# Patient Record
Sex: Male | Born: 1975 | Race: Black or African American | Hispanic: No | Smoking: Current every day smoker
Health system: Southern US, Community
[De-identification: ages and names within clinical notes are randomized; demographics above are authoritative.]

## PROBLEM LIST (undated history)

## (undated) DIAGNOSIS — L511 Stevens-Johnson syndrome: Secondary | ICD-10-CM

## (undated) DIAGNOSIS — D649 Anemia, unspecified: Secondary | ICD-10-CM

---

## 2000-08-09 ENCOUNTER — Emergency Department (HOSPITAL_COMMUNITY): Admission: EM | Admit: 2000-08-09 | Discharge: 2000-08-09 | Payer: Self-pay | Admitting: *Deleted

## 2000-12-18 ENCOUNTER — Inpatient Hospital Stay (HOSPITAL_COMMUNITY): Admission: EM | Admit: 2000-12-18 | Discharge: 2000-12-21 | Payer: Self-pay | Admitting: Emergency Medicine

## 2003-03-15 ENCOUNTER — Emergency Department (HOSPITAL_COMMUNITY): Admission: EM | Admit: 2003-03-15 | Discharge: 2003-03-15 | Payer: Self-pay | Admitting: Emergency Medicine

## 2009-07-28 ENCOUNTER — Observation Stay (HOSPITAL_COMMUNITY): Admission: EM | Admit: 2009-07-28 | Discharge: 2009-07-31 | Payer: Self-pay | Admitting: Emergency Medicine

## 2009-07-31 ENCOUNTER — Ambulatory Visit: Payer: Self-pay | Admitting: Oncology

## 2009-08-16 ENCOUNTER — Encounter: Payer: Self-pay | Admitting: Infectious Diseases

## 2009-08-16 LAB — CBC WITH DIFFERENTIAL/PLATELET
BASO%: 0.8 % (ref 0.0–2.0)
Basophils Absolute: 0.1 10*3/uL (ref 0.0–0.1)
EOS%: 3.6 % (ref 0.0–7.0)
Eosinophils Absolute: 0.4 10*3/uL (ref 0.0–0.5)
HCT: 35.3 % — ABNORMAL LOW (ref 38.4–49.9)
HGB: 10.6 g/dL — ABNORMAL LOW (ref 13.0–17.1)
LYMPH%: 17.9 % (ref 14.0–49.0)
MCH: 21.2 pg — ABNORMAL LOW (ref 27.2–33.4)
MCHC: 30 g/dL — ABNORMAL LOW (ref 32.0–36.0)
MCV: 70.5 fL — ABNORMAL LOW (ref 79.3–98.0)
MONO#: 0.9 10*3/uL (ref 0.1–0.9)
MONO%: 7.6 % (ref 0.0–14.0)
NEUT#: 8.1 10*3/uL — ABNORMAL HIGH (ref 1.5–6.5)
NEUT%: 70.1 % (ref 39.0–75.0)
Platelets: 1013 10*3/uL — ABNORMAL HIGH (ref 140–400)
RBC: 5.01 10*6/uL (ref 4.20–5.82)
RDW: 27.8 % — ABNORMAL HIGH (ref 11.0–14.6)
WBC: 11.5 10*3/uL — ABNORMAL HIGH (ref 4.0–10.3)
lymph#: 2.1 10*3/uL (ref 0.9–3.3)
nRBC: 0 % (ref 0–0)

## 2009-08-16 LAB — MORPHOLOGY: PLT EST: INCREASED

## 2009-08-16 LAB — COMPREHENSIVE METABOLIC PANEL
ALT: 26 U/L (ref 0–53)
AST: 40 U/L — ABNORMAL HIGH (ref 0–37)
Albumin: 3.9 g/dL (ref 3.5–5.2)
Alkaline Phosphatase: 41 U/L (ref 39–117)
BUN: 10 mg/dL (ref 6–23)
CO2: 27 mEq/L (ref 19–32)
Calcium: 9.1 mg/dL (ref 8.4–10.5)
Chloride: 105 mEq/L (ref 96–112)
Creatinine, Ser: 0.95 mg/dL (ref 0.40–1.50)
Glucose, Bld: 90 mg/dL (ref 70–99)
Potassium: 3.8 mEq/L (ref 3.5–5.3)
Sodium: 138 mEq/L (ref 135–145)
Total Bilirubin: 0.7 mg/dL (ref 0.3–1.2)
Total Protein: 7.5 g/dL (ref 6.0–8.3)

## 2009-08-16 LAB — CHCC SMEAR

## 2009-08-16 LAB — LACTATE DEHYDROGENASE: LDH: 133 U/L (ref 94–250)

## 2009-08-20 LAB — SPEP & IFE WITH QIG
Albumin ELP: 56.1 % (ref 55.8–66.1)
Alpha-1-Globulin: 4.8 % (ref 2.9–4.9)
Alpha-2-Globulin: 10 % (ref 7.1–11.8)
Beta 2: 5.9 % (ref 3.2–6.5)
Beta Globulin: 7.2 % (ref 4.7–7.2)
Gamma Globulin: 16 % (ref 11.1–18.8)
IgA: 313 mg/dL (ref 68–378)
IgG (Immunoglobin G), Serum: 1280 mg/dL (ref 694–1618)
IgM, Serum: 141 mg/dL (ref 60–263)
Total Protein, Serum Electrophoresis: 7.2 g/dL (ref 6.0–8.3)

## 2009-08-20 LAB — KAPPA/LAMBDA LIGHT CHAINS
Kappa free light chain: 0.98 mg/dL (ref 0.33–1.94)
Kappa:Lambda Ratio: 1.01 (ref 0.26–1.65)
Lambda Free Lght Chn: 0.97 mg/dL (ref 0.57–2.63)

## 2009-08-20 LAB — SEDIMENTATION RATE: Sed Rate: 10 mm/hr (ref 0–16)

## 2009-08-20 LAB — METHYLMALONIC ACID, SERUM: Methylmalonic Acid, Quantitative: 93 nmol/L (ref 87–318)

## 2009-08-21 ENCOUNTER — Other Ambulatory Visit: Admission: RE | Admit: 2009-08-21 | Discharge: 2009-08-21 | Payer: Self-pay | Admitting: Oncology

## 2009-08-21 LAB — JAK-2 V617F

## 2009-09-18 ENCOUNTER — Ambulatory Visit: Payer: Self-pay | Admitting: Oncology

## 2009-09-24 LAB — CBC WITH DIFFERENTIAL/PLATELET
BASO%: 0.1 % (ref 0.0–2.0)
Basophils Absolute: 0 10*3/uL (ref 0.0–0.1)
EOS%: 6.9 % (ref 0.0–7.0)
Eosinophils Absolute: 0.6 10*3/uL — ABNORMAL HIGH (ref 0.0–0.5)
HCT: 32.2 % — ABNORMAL LOW (ref 38.4–49.9)
HGB: 10.2 g/dL — ABNORMAL LOW (ref 13.0–17.1)
LYMPH%: 23.6 % (ref 14.0–49.0)
MCH: 22.2 pg — ABNORMAL LOW (ref 27.2–33.4)
MCHC: 31.7 g/dL — ABNORMAL LOW (ref 32.0–36.0)
MCV: 69.9 fL — ABNORMAL LOW (ref 79.3–98.0)
MONO#: 0.6 10*3/uL (ref 0.1–0.9)
MONO%: 7.9 % (ref 0.0–14.0)
NEUT#: 4.9 10*3/uL (ref 1.5–6.5)
NEUT%: 61.5 % (ref 39.0–75.0)
Platelets: 205 10*3/uL (ref 140–400)
RBC: 4.61 10*6/uL (ref 4.20–5.82)
RDW: 28.6 % — ABNORMAL HIGH (ref 11.0–14.6)
WBC: 8 10*3/uL (ref 4.0–10.3)
lymph#: 1.9 10*3/uL (ref 0.9–3.3)

## 2010-01-31 ENCOUNTER — Ambulatory Visit: Payer: Self-pay | Admitting: Oncology

## 2010-04-16 NOTE — Consult Note (Signed)
[facility]: [facility]   Imported By: Florinda Marker 09/13/2009 14:31:18  _____________________________________________________________________  External Attachment:    Type:   Image     Comment:   External Document

## 2010-06-03 LAB — CBC
HCT: 22.1 % — ABNORMAL LOW (ref 39.0–52.0)
HCT: 31 % — ABNORMAL LOW (ref 39.0–52.0)
HCT: 32.1 % — ABNORMAL LOW (ref 39.0–52.0)
HCT: 32.5 % — ABNORMAL LOW (ref 39.0–52.0)
Hemoglobin: 10 g/dL — ABNORMAL LOW (ref 13.0–17.0)
Hemoglobin: 10.2 g/dL — ABNORMAL LOW (ref 13.0–17.0)
Hemoglobin: 6.8 g/dL — CL (ref 13.0–17.0)
MCHC: 30.9 g/dL (ref 30.0–36.0)
MCHC: 31 g/dL (ref 30.0–36.0)
MCHC: 31.8 g/dL (ref 30.0–36.0)
MCHC: 32.1 g/dL (ref 30.0–36.0)
MCV: 65.3 fL — ABNORMAL LOW (ref 78.0–100.0)
MCV: 70.4 fL — ABNORMAL LOW (ref 78.0–100.0)
MCV: 70.5 fL — ABNORMAL LOW (ref 78.0–100.0)
Platelets: 62 10*3/uL — ABNORMAL LOW (ref 150–400)
Platelets: 63 10*3/uL — ABNORMAL LOW (ref 150–400)
Platelets: 68 10*3/uL — ABNORMAL LOW (ref 150–400)
Platelets: 74 10*3/uL — ABNORMAL LOW (ref 150–400)
RBC: 3.39 MIL/uL — ABNORMAL LOW (ref 4.22–5.81)
RBC: 4.4 MIL/uL (ref 4.22–5.81)
RBC: 4.56 MIL/uL (ref 4.22–5.81)
RDW: 31.2 % — ABNORMAL HIGH (ref 11.5–15.5)
RDW: 31.6 % — ABNORMAL HIGH (ref 11.5–15.5)
RDW: 32.2 % — ABNORMAL HIGH (ref 11.5–15.5)
RDW: 32.5 % — ABNORMAL HIGH (ref 11.5–15.5)
WBC: 10.7 10*3/uL — ABNORMAL HIGH (ref 4.0–10.5)
WBC: 11.1 10*3/uL — ABNORMAL HIGH (ref 4.0–10.5)
WBC: 13.1 10*3/uL — ABNORMAL HIGH (ref 4.0–10.5)

## 2010-06-03 LAB — CARDIAC PANEL(CRET KIN+CKTOT+MB+TROPI)
CK, MB: 0.5 ng/mL (ref 0.3–4.0)
CK, MB: 0.7 ng/mL (ref 0.3–4.0)
Relative Index: 0.4 (ref 0.0–2.5)
Relative Index: 0.5 (ref 0.0–2.5)
Total CK: 121 U/L (ref 7–232)
Total CK: 134 U/L (ref 7–232)
Troponin I: <0.01 ng/mL (ref 0.00–0.06)
Troponin I: <0.01 ng/mL (ref 0.00–0.06)

## 2010-06-03 LAB — PARVOVIRUS B19 ANTIBODY, IGG AND IGM
Parovirus B19 IgG Abs: 1.4 Index — ABNORMAL HIGH (ref <0.9)
Parovirus B19 IgM Abs: <0.9 Index (ref <0.9)

## 2010-06-03 LAB — HEMOGLOBINOPATHY EVALUATION
Hemoglobin Other: 0 % (ref 0.0–0.0)
Hgb A2 Quant: 2.6 % (ref 2.2–3.2)
Hgb A: 61.4 % — ABNORMAL LOW (ref 96.8–97.8)
Hgb F Quant: 0 % (ref 0.0–2.0)
Hgb S Quant: 36 % — ABNORMAL HIGH (ref 0.0–0.0)

## 2010-06-03 LAB — ANA: Anti Nuclear Antibody(ANA): NEGATIVE

## 2010-06-03 LAB — EPSTEIN-BARR VIRUS VCA ANTIBODY PANEL
EBV EA IgG: 0.69 {ISR}
EBV EA IgG: 0.71 {ISR}
EBV NA IgG: 0.56 {ISR}
EBV NA IgG: 0.73 {ISR}
EBV VCA IgG: 3.4 {ISR} — ABNORMAL HIGH
EBV VCA IgG: 3.89 {ISR} — ABNORMAL HIGH
EBV VCA IgM: 0.12 {ISR}
EBV VCA IgM: 0.16 {ISR}

## 2010-06-03 LAB — LACTATE DEHYDROGENASE: LDH: 98 U/L (ref 94–250)

## 2010-06-03 LAB — TECHNOLOGIST SMEAR REVIEW

## 2010-06-03 LAB — RUBELLA ANTIBODY, IGM: Rubella IgM: <0.9 S/CO Ratio (ref <0.9)

## 2010-06-03 LAB — HEPATITIS PANEL, ACUTE
HCV Ab: NEGATIVE
Hep A IgM: NEGATIVE
Hep B C IgM: NEGATIVE
Hepatitis B Surface Ag: NEGATIVE

## 2010-06-03 LAB — MAGNESIUM: Magnesium: 2.4 mg/dL (ref 1.5–2.5)

## 2010-06-03 LAB — IRON AND TIBC
Iron: 221 ug/dL — ABNORMAL HIGH (ref 42–135)
Saturation Ratios: 54 % (ref 20–55)
TIBC: 406 ug/dL (ref 215–435)

## 2010-06-03 LAB — C-REACTIVE PROTEIN: CRP: 0.9 mg/dL — ABNORMAL HIGH (ref <0.6)

## 2010-06-03 LAB — ETHANOL: Alcohol, Ethyl (B): <5 mg/dL (ref 0–10)

## 2010-06-03 LAB — RETICULOCYTES: RBC.: 4.73 MIL/uL (ref 4.22–5.81)

## 2010-06-03 LAB — BONE MARROW EXAM: Bone Marrow Exam: 414

## 2010-06-03 LAB — PHOSPHORUS: Phosphorus: 4.1 mg/dL (ref 2.3–4.6)

## 2010-06-03 LAB — CHROMOSOME ANALYSIS, BONE MARROW

## 2010-06-03 LAB — VITAMIN B12: Vitamin B-12: 386 pg/mL (ref 211–911)

## 2010-06-03 LAB — HIV ANTIBODY (ROUTINE TESTING W REFLEX): HIV: NONREACTIVE

## 2010-06-04 LAB — CROSSMATCH: Antibody Screen: NEGATIVE

## 2010-06-04 LAB — CULTURE, BLOOD (ROUTINE X 2)
Culture: NO GROWTH
Culture: NO GROWTH

## 2010-06-04 LAB — URINALYSIS, ROUTINE W REFLEX MICROSCOPIC
Bilirubin Urine: NEGATIVE
Glucose, UA: NEGATIVE mg/dL
Ketones, ur: 15 mg/dL — AB
Nitrite: NEGATIVE
Specific Gravity, Urine: 1.011 (ref 1.005–1.030)
pH: 7 (ref 5.0–8.0)

## 2010-06-04 LAB — CBC
Platelets: 82 10*3/uL — ABNORMAL LOW (ref 150–400)
RBC: 3.7 MIL/uL — ABNORMAL LOW (ref 4.22–5.81)
WBC: 13.9 10*3/uL — ABNORMAL HIGH (ref 4.0–10.5)

## 2010-06-04 LAB — RAPID URINE DRUG SCREEN, HOSP PERFORMED
Barbiturates: NOT DETECTED
Benzodiazepines: NOT DETECTED
Cocaine: POSITIVE — AB

## 2010-06-04 LAB — DIFFERENTIAL
Basophils Absolute: 0 10*3/uL (ref 0.0–0.1)
Eosinophils Relative: 1 % (ref 0–5)
Lymphocytes Relative: 13 % (ref 12–46)
Monocytes Relative: 5 % (ref 3–12)
Neutrophils Relative %: 81 % — ABNORMAL HIGH (ref 43–77)

## 2010-06-04 LAB — HEPATIC FUNCTION PANEL
ALT: <8 U/L (ref 0–53)
AST: 15 U/L (ref 0–37)
Albumin: 3.4 g/dL — ABNORMAL LOW (ref 3.5–5.2)
Alkaline Phosphatase: 47 U/L (ref 39–117)
Bilirubin, Direct: 0.1 mg/dL (ref 0.0–0.3)
Total Bilirubin: 0.8 mg/dL (ref 0.3–1.2)

## 2010-06-04 LAB — GLUCOSE, CAPILLARY: Glucose-Capillary: 255 mg/dL — ABNORMAL HIGH (ref 70–99)

## 2010-06-04 LAB — HIV ANTIBODY (ROUTINE TESTING W REFLEX): HIV: NONREACTIVE

## 2010-06-04 LAB — BASIC METABOLIC PANEL
BUN: 10 mg/dL (ref 6–23)
Calcium: 8.8 mg/dL (ref 8.4–10.5)
Chloride: 106 mEq/L (ref 96–112)
Creatinine, Ser: 0.77 mg/dL (ref 0.4–1.5)
GFR calc Af Amer: >60 mL/min (ref >60)
GFR calc non Af Amer: >60 mL/min (ref >60)

## 2010-06-04 LAB — HEMOCCULT GUIAC POC 1CARD (OFFICE): Fecal Occult Bld: NEGATIVE

## 2010-06-04 LAB — ABO/RH: ABO/RH(D): B POS

## 2010-07-09 ENCOUNTER — Emergency Department (HOSPITAL_COMMUNITY)
Admission: EM | Admit: 2010-07-09 | Discharge: 2010-07-09 | Disposition: A | Discharge status: Home / Self Care | Payer: Self-pay | Attending: Emergency Medicine | Admitting: Emergency Medicine

## 2010-07-09 DIAGNOSIS — L0231 Cutaneous abscess of buttock: Secondary | ICD-10-CM | POA: Insufficient documentation

## 2010-08-02 NOTE — Discharge Summary (Signed)
[place]. [facility] [place]  Patient:    John Moody, John Moody Visit Number: [id number] MRN: [id number]          Service Type: MED Location: [phone] Attending Physician:  Madaline Guthrie Dictated by:   Marisue Brooklyn, M.D. Admit Date:  12/18/2000 Discharge Date: 12/21/2000   CC:         Gelene Mink A. Worthy Rancher, M.D.  Orson Ape, MS-4   Discharge Summary  DATE OF BIRTH:  03-07-1976  HOME ADDRESS:  [address], [place], [place] [address].  PHONE NUMBER:  [phone]  DISCHARGE DIAGNOSIS:  Stevens-Johnson reaction.  DISCHARGE MEDICATIONS:  Valtrex 1000 mg a half tablet p.o. q.d., Magic mouthwash gargle and spit p.r.n. for mouth pain, fluocinonide 0.05% apply to effected area sparingly b.i.d., prednisone 20 mg two tablets twice a day for one day and then one tablet twice a day for one day, artificial tears one to two drops in each eye four times a day and p.r.n., Tylenol No. 3 one to two tablets p.o. q.4-6h. p.r.n. pain.  DISPOSITION:  Home.  CONDITION:  Stable.  FOLLOWUP APPOINTMENT:  The patient will contact Dr. Terri Piedra to arrange followup.  HOSPITAL COURSE:  The patient presented with numerous lesions on palms, soles, antecubital fossa, genitals, oral mucosa.  The patient has a history for 11 years of similar lesions.  The patient has been unable to eat because of painful mouth lesions.  The patient was admitted secondary to dehydration and possibility of secondary infection.  The patients lab - blood culture x 2, no growth at five days.  WBC at admission was 15.6, hemoglobin 14.3, hematocrit 43.3, MCV 77.0, platelets 325.  Neutrophils 84%, lymphocytes 6%, monocytes 9%, eosinophil 1%, basophil 0.  Sodium 136, potassium 3.8, chloride 102, CO2 23, glucose 86, BUN 12, creatinine 1.0 with calcium 8.9.  Total protein 7.5, albumin 3.5, AST 25, ALT 9, ALP 53, total bilirubin 1.1.  Dermatology was consulted.  Ophthalmology was  consulted.  The patient is a 35 year old African-American male who presented with bullous lesions on the arms, genitals, oral mucosa, and palms.  Target lesions were present on the palms and soles.  The patient was placed on acyclovir and prednisone.  The patient has had negative herpes cultures in the past, but has improved per report by Dr. Terri Piedra on acyclovir treatment.  Indeed, over the next two days, the patients lesions improved.  On admission, the patient was tolerating food and p.o. medicines.  The patients pain was controlled with Tylenol No. 3.  An HSV/PCR performed during this hospitalization was also negative.   Dictated by Arlys John ________, MS-4 Dictated by:   Marisue Brooklyn, M.D. Attending Physician:  Madaline Guthrie DD:  12/22/00 TD:  12/23/00 Job: [id number]

## 2012-08-03 ENCOUNTER — Encounter (HOSPITAL_COMMUNITY): Payer: Self-pay | Admitting: *Deleted

## 2012-08-03 ENCOUNTER — Emergency Department (HOSPITAL_COMMUNITY)
Admission: EM | Admit: 2012-08-03 | Discharge: 2012-08-03 | Disposition: A | Discharge status: Home / Self Care | Payer: Self-pay | Attending: Emergency Medicine | Admitting: Emergency Medicine

## 2012-08-03 ENCOUNTER — Emergency Department (HOSPITAL_COMMUNITY): Payer: Self-pay

## 2012-08-03 DIAGNOSIS — Z872 Personal history of diseases of the skin and subcutaneous tissue: Secondary | ICD-10-CM | POA: Insufficient documentation

## 2012-08-03 DIAGNOSIS — R590 Localized enlarged lymph nodes: Secondary | ICD-10-CM

## 2012-08-03 DIAGNOSIS — S0083XA Contusion of other part of head, initial encounter: Secondary | ICD-10-CM

## 2012-08-03 DIAGNOSIS — Y92410 Unspecified street and highway as the place of occurrence of the external cause: Secondary | ICD-10-CM | POA: Insufficient documentation

## 2012-08-03 DIAGNOSIS — R599 Enlarged lymph nodes, unspecified: Secondary | ICD-10-CM | POA: Insufficient documentation

## 2012-08-03 DIAGNOSIS — Y9389 Activity, other specified: Secondary | ICD-10-CM | POA: Insufficient documentation

## 2012-08-03 DIAGNOSIS — S0003XA Contusion of scalp, initial encounter: Secondary | ICD-10-CM | POA: Insufficient documentation

## 2012-08-03 HISTORY — DX: Stevens-Johnson syndrome: L51.1

## 2012-08-03 MED ORDER — HYDROCODONE-ACETAMINOPHEN 5-325 MG PO TABS
2.0000 | ORAL_TABLET | Freq: Once | ORAL | Status: AC
  Administered 2012-08-03: 2 via ORAL
  Filled 2012-08-03: qty 2

## 2012-08-03 MED ORDER — NAPROXEN 500 MG PO TABS: 500.0000 mg | ORAL_TABLET | Freq: Two times a day (BID) | ORAL | Status: DC
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
## 2012-08-04 NOTE — ED Provider Notes (Signed)
Medical screening examination/treatment/procedure(s) were performed by non-physician practitioner and as supervising physician I was immediately available for consultation/collaboration.   Quiana Cobaugh, MD 08/04/12 0026 

## 2012-08-18 ENCOUNTER — Emergency Department (HOSPITAL_COMMUNITY)
Admission: EM | Admit: 2012-08-18 | Discharge: 2012-08-18 | Disposition: A | Discharge status: Home / Self Care | Payer: Self-pay | Attending: Emergency Medicine | Admitting: Emergency Medicine

## 2012-08-18 ENCOUNTER — Encounter (HOSPITAL_COMMUNITY): Payer: Self-pay | Admitting: Emergency Medicine

## 2012-08-18 DIAGNOSIS — Z872 Personal history of diseases of the skin and subcutaneous tissue: Secondary | ICD-10-CM | POA: Insufficient documentation

## 2012-08-18 DIAGNOSIS — F17200 Nicotine dependence, unspecified, uncomplicated: Secondary | ICD-10-CM | POA: Insufficient documentation

## 2012-08-18 DIAGNOSIS — B029 Zoster without complications: Secondary | ICD-10-CM | POA: Insufficient documentation

## 2012-08-18 MED ORDER — HYDROCODONE-ACETAMINOPHEN 5-325 MG PO TABS: ORAL_TABLET | ORAL | Status: DC

## 2012-08-18 MED ORDER — TETRACAINE HCL 0.5 % OP SOLN
2.0000 [drp] | Freq: Once | OPHTHALMIC | Status: DC
  Filled 2012-08-18: qty 2

## 2012-08-18 MED ORDER — ACYCLOVIR 200 MG PO CAPS
800.0000 mg | ORAL_CAPSULE | ORAL | Status: AC
  Administered 2012-08-18: 800 mg via ORAL
  Filled 2012-08-18 (×2): qty 4

## 2012-08-18 MED ORDER — FLUORESCEIN SODIUM 1 MG OP STRP
1.0000 | ORAL_STRIP | Freq: Once | OPHTHALMIC | Status: AC
  Administered 2012-08-18: 1 via OPHTHALMIC
  Filled 2012-08-18: qty 1

## 2012-08-18 MED ORDER — VALACYCLOVIR HCL 1 G PO TABS: 1000.0000 mg | ORAL_TABLET | Freq: Three times a day (TID) | ORAL | Status: AC

## 2012-08-18 NOTE — ED Provider Notes (Signed)
History     CSN: [id number]  Arrival date & time 08/18/12  1225   First MD Initiated Contact with Patient 08/18/12 1252      Chief Complaint  Patient presents with  . Rash    (Consider location/radiation/quality/duration/timing/severity/associated sxs/prior treatment) HPI  John Moody is a 37 y.o. male painful rash to left forehead onset 3 days ago, she states the rash is also in his hair . Patient's denies change in vision. He had chickenpox as a child.  Denies fever chest pain, shortness of breath, nausea vomiting, change in bowel or bladder habits.  Past Medical History  Diagnosis Date  . Stevens-Johnson disease     No past surgical history on file.  No family history on file.  History  Substance Use Topics  . Smoking status: Current Every Day Smoker  . Smokeless tobacco: Not on file  . Alcohol Use: Yes      Review of Systems  Constitutional: Negative for fever.  Respiratory: Negative for shortness of breath.   Cardiovascular: Negative for chest pain.  Gastrointestinal: Negative for nausea, vomiting, abdominal pain and diarrhea.  Skin: Positive for rash.  All other systems reviewed and are negative.    Allergies  Review of patient's allergies indicates no known allergies.  Home Medications   Current Outpatient Rx  Name  Route  Sig  Dispense  Refill  . ibuprofen (ADVIL,MOTRIN) 200 MG tablet   Oral   Take 200 mg by mouth every 6 (six) hours as needed for pain.         . naproxen (NAPROSYN) 500 MG tablet   Oral   Take 1 tablet (500 mg total) by mouth 2 (two) times daily with a meal.   30 tablet   0     BP 103/72  Pulse 69  Temp(Src) 98.4 F (36.9 C)  Resp 16  SpO2 100%  Physical Exam  Nursing note and vitals reviewed. Constitutional: He is oriented to person, place, and time. He appears well-developed and well-nourished. No distress.  HENT:  Head: Normocephalic.    Vesicular rash to ophthalmic division of left ophthalmic nerve.    Eyes: Conjunctivae and EOM are normal.  Cardiovascular: Normal rate.   Pulmonary/Chest: Effort normal. No stridor.  Musculoskeletal: Normal range of motion.  Neurological: He is alert and oriented to person, place, and time.  Psychiatric: He has a normal mood and affect.    ED Course  Procedures (including critical care time)  Labs Reviewed - No data to display No results found.   1. Zoster       MDM   Filed Vitals:   08/18/12 1245  BP: 103/72  Pulse: 69  Temp: 98.4 F (36.9 C)  Resp: 16  SpO2: 100%     John Moody is a 37 y.o. male  with rash consistent with zoster I am concerned for ophthalmicus. left left eye fluorescein stain shows no dendritic lesions. I've asked him to follow with ophthalmology.  Medications  tetracaine (PONTOCAINE) 0.5 % ophthalmic solution 2 drop (not administered)  acyclovir (ZOVIRAX) 200 MG capsule 800 mg (800 mg Oral Given 08/18/12 1454)  fluorescein ophthalmic strip 1 strip (1 strip Both Eyes Given 08/18/12 1456)   This is a shared visit with the attending physician who personally evaluated the patient and agrees with the care plan.   The patient is hemodynamically stable, appropriate for, and amenable to, discharge at this time. Pt verbalized understanding and agrees with care plan. Outpatient follow-up and return  precautions given.    Discharge Medication List as of 08/18/2012  2:59 PM    START taking these medications   Details  HYDROcodone-acetaminophen (NORCO/VICODIN) 5-325 MG per tablet Take 1-2 tablets by mouth every 6 hours as needed for pain., Print    valACYclovir (VALTREX) 1000 MG tablet Take 1 tablet (1,000 mg total) by mouth 3 (three) times daily., Starting 08/18/2012, Last dose on Wed 09/01/12, [employer], PA-C 08/18/12 1640

## 2012-08-18 NOTE — ED Notes (Signed)
Rash on left side of forehead strated on Monday no diff breathing

## 2012-08-18 NOTE — ED Notes (Signed)
Pt reports with rash on forehead, nose and knot behind ear.  Pt co headache

## 2012-08-19 NOTE — ED Provider Notes (Signed)
Medical screening examination/treatment/procedure(s) were conducted as a shared visit with non-physician practitioner(s) and myself.  I personally evaluated the patient during the encounter Pt c/o painful rash to left forehead/frontal scalp for past couple days. No eye pain or decreased vision. Exam c/w shingles. Will check eye/cornea, flourscein. Rx.   Suzi Roots, MD 08/19/12 [phone]

## 2015-02-16 ENCOUNTER — Emergency Department (HOSPITAL_COMMUNITY)
Admission: EM | Admit: 2015-02-16 | Discharge: 2015-02-16 | Disposition: A | Discharge status: Home / Self Care | Payer: Self-pay | Attending: Emergency Medicine | Admitting: Emergency Medicine

## 2015-02-16 ENCOUNTER — Encounter (HOSPITAL_COMMUNITY): Payer: Self-pay | Admitting: Emergency Medicine

## 2015-02-16 DIAGNOSIS — K0381 Cracked tooth: Secondary | ICD-10-CM | POA: Insufficient documentation

## 2015-02-16 DIAGNOSIS — K055 Other periodontal diseases: Secondary | ICD-10-CM | POA: Insufficient documentation

## 2015-02-16 DIAGNOSIS — Z791 Long term (current) use of non-steroidal anti-inflammatories (NSAID): Secondary | ICD-10-CM | POA: Insufficient documentation

## 2015-02-16 DIAGNOSIS — Z872 Personal history of diseases of the skin and subcutaneous tissue: Secondary | ICD-10-CM | POA: Insufficient documentation

## 2015-02-16 DIAGNOSIS — F17210 Nicotine dependence, cigarettes, uncomplicated: Secondary | ICD-10-CM | POA: Insufficient documentation

## 2015-02-16 DIAGNOSIS — J029 Acute pharyngitis, unspecified: Secondary | ICD-10-CM | POA: Insufficient documentation

## 2015-02-16 DIAGNOSIS — K047 Periapical abscess without sinus: Secondary | ICD-10-CM | POA: Insufficient documentation

## 2015-02-16 MED ORDER — OXYCODONE-ACETAMINOPHEN 5-325 MG PO TABS: 1.0000 | ORAL_TABLET | ORAL | Status: DC | PRN

## 2015-02-16 MED ORDER — OXYCODONE-ACETAMINOPHEN 5-325 MG PO TABS
2.0000 | ORAL_TABLET | Freq: Once | ORAL | Status: AC
  Administered 2015-02-16: 2 via ORAL
  Filled 2015-02-16: qty 2

## 2015-02-16 MED ORDER — PENICILLIN V POTASSIUM 500 MG PO TABS: 500.0000 mg | ORAL_TABLET | Freq: Four times a day (QID) | ORAL | Status: DC

## 2015-02-16 NOTE — ED Notes (Signed)
Pt from home for eval of tooth pain that started today, reddness and poor dental hygiene noted. Pt denies any n/v/d or fevers at this time.

## 2015-02-16 NOTE — Discharge Instructions (Signed)
Dental Pain Dental pain may be caused by many things, including:  Tooth decay (cavities or caries). Cavities expose the nerve of your tooth to air and hot or cold temperatures. This can cause pain or discomfort.  Abscess or infection. A dental abscess is a collection of infected pus from a bacterial infection in the inner part of the tooth (pulp). It usually occurs at the end of the tooth's root.  Injury.  An unknown reason (idiopathic). Your pain may be mild or severe. It may only occur when:  You are chewing.  You are exposed to hot or cold temperature.  You are eating or drinking sugary foods or beverages, such as soda or candy. Your pain may also be constant. HOME CARE INSTRUCTIONS Watch your dental pain for any changes. The following actions may help to lessen any discomfort that you are feeling:  Take medicines only as directed by your dentist.  If you were prescribed an antibiotic medicine, finish all of it even if you start to feel better.  Keep all follow-up visits as directed by your dentist. This is important.  Do not apply heat to the outside of your face.  Rinse your mouth or gargle with salt water if directed by your dentist. This helps with pain and swelling.  You can make salt water by adding  tsp of salt to 1 cup of warm water.  Apply ice to the painful area of your face:  Put ice in a plastic bag.  Place a towel between your skin and the bag.  Leave the ice on for 20 minutes, 2-3 times per day.  Avoid foods or drinks that cause you pain, such as:  Very hot or very cold foods or drinks.  Sweet or sugary foods or drinks. SEEK MEDICAL CARE IF:  Your pain is not controlled with medicines.  Your symptoms are worse.  You have new symptoms. SEEK IMMEDIATE MEDICAL CARE IF:  You are unable to open your mouth.  You are having trouble breathing or swallowing.  You have a fever.  Your face, neck, or jaw is swollen.   This information is not  intended to replace advice given to you by your health care provider. Make sure you discuss any questions you have with your health care provider.   Document Released: 03/03/2005 Document Revised: 07/18/2014 Document Reviewed: 02/27/2014 Elsevier Interactive Patient [facility]  Community Service Learning [facility]  [address]  [place], [place] [address]  Phone [phone]  The [employer] [facility] [place] exemplifies the [employer] vision to improve the health and quality of life of all [place]ians by Public house manager with a passion to care for the underserved and by leading the nation in community-based, service learning oral health education. We are committed to offering comprehensive general dental services for adults, children and special needs patients in a safe, caring and professional setting.  Appointments: Our clinic is open Monday through Friday 8:00 a.m. until 5:00 p.m. The amount of time scheduled for an appointment depends on the patients specific needs. We ask that you keep your appointed time for care or provide 24-hour notice of all appointment changes. Parents or legal guardians must accompany minor children.  Payment for Services: Medicaid and other insurance plans are welcome. Payment for services is due when services are rendered and may be made by cash or credit card. If you have dental insurance,  we will assist you with your claim submission.   Emergencies: Emergency services will be provided Monday through Friday on a walk-in basis. Please arrive early for emergency services. After hours emergency services will be provided for patients of record as required.  Services:  Medical illustratorComprehensive General Dentistry  Childrens Dentistry  Oral Surgery - Extractions  Root Canals  Sealants and Tooth Colored Fillings  Crowns and  Bridges  Dentures and Partial Dentures  Implant Services  Periodontal Services and Cleanings  Cosmetic Building services engineerTooth Whitening  Digital Radiography  3[employer]   Education [employer]2016 Elsevier Inc[employer]

## 2015-02-16 NOTE — ED Provider Notes (Signed)
CSN: [id number]     Arrival date & time 02/16/15  1352 History  By signing my name below, I, [name], attest that this documentation has been prepared under the direction and in the presence of non-physician practitioner, [name], PA-C. Electronically Signed: [name], Scribe. 02/16/2015. 2:16 PM.    Chief Complaint  Patient presents with  . Dental Pain    The history is provided by the patient. No language interpreter was used.     HPI Comments:  John Moody is a 39 y.o. male who presents to the Emergency Department complaining of right sided dental pain that began this AM. He reports associated sore throat. Pt denies fever, difficulty swallowing, nausea and vomiting. No alleviating factors noted. Pt is a current everyday smoker.    Past Medical History  Diagnosis Date  . Stevens-Johnson disease (HCC)    History reviewed. No pertinent past surgical history. No family history on file. Social History  Substance Use Topics  . Smoking status: Current Every Day Smoker -- 0.50 packs/day    Types: Cigarettes  . Smokeless tobacco: None  . Alcohol Use: Yes    Review of Systems  Constitutional: Negative for fever and chills.  HENT: Positive for dental problem and sore throat. Negative for trouble swallowing.   Respiratory: Negative for shortness of breath.   Cardiovascular: Negative for chest pain.  Gastrointestinal: Negative for nausea and vomiting.    Allergies  Review of patient's allergies indicates no known allergies.  Home Medications   Prior to Admission medications   Medication Sig Start Date End Date Taking? Authorizing Provider  ibuprofen (ADVIL,MOTRIN) 200 MG tablet Take 200 mg by mouth every 6 (six) hours as needed for pain.    Historical Provider, MD  naproxen (NAPROSYN) 500 MG tablet Take 1 tablet (500 mg total) by mouth 2 (two) times daily with a meal. 08/03/12   Hannah Muthersbaugh, PA-C  oxyCODONE-acetaminophen (PERCOCET) 5-325 MG tablet Take  1-2 tablets by mouth every 4 (four) hours as needed. 02/16/15   [name], PA-C  penicillin v potassium (VEETID) 500 MG tablet Take 1 tablet (500 mg total) by mouth 4 (four) times daily. 02/16/15   Patrice Matthew, PA-C   BP 122/74 mmHg  Pulse 75  Temp(Src) 98.2 F (36.8 C) (Oral)  Resp 18  Wt 54.658 kg  SpO2 100% Physical Exam  Constitutional: He is oriented to person, place, and time. He appears well-developed and well-nourished. No distress.  HENT:  Head: Normocephalic and atraumatic.  Severe periodontal disease  Right upper molar with abnormal structure: cracked and extremely lose Foul smelling dentition Teeth rotten with severely recessed gums and exposed tooth roots  No overt abscess    Eyes: Conjunctivae are normal.  Cardiovascular: Normal rate.   Pulmonary/Chest: Effort normal.  Abdominal: He exhibits no distension.  Neurological: He is alert and oriented to person, place, and time.  Skin: Skin is warm and dry.  Psychiatric: He has a normal mood and affect.  Nursing note and vitals reviewed.   ED Course  Procedures   DIAGNOSTIC STUDIES:  Oxygen Saturation is 100% on RA, normal by my interpretation.    COORDINATION OF CARE:  2:16 PM Discussed treatment plan with pt at bedside and pt agreed to plan.   MDM   Final diagnoses:  Dental infection    Patient with dentalgia.  No abscess requiring immediate incision and drainage.  Exam not concerning for Ludwig's angina or pharyngeal abscess.  Will treat with PCN and pain meds.  Pt instructed to follow-up with dentist.  Discussed return precautions. Pt safe for discharge.   I personally performed the services described in this documentation, which was scribed in my presence. The recorded information has been reviewed and is accurate.      Arthor Captainbigail Kellyanne Ellwanger, PA-C 02/16/15 1622  Pricilla LovelessScott Goldston, MD 02/19/15 [phone]

## 2016-11-20 ENCOUNTER — Emergency Department (HOSPITAL_COMMUNITY): Payer: Self-pay

## 2016-11-20 ENCOUNTER — Encounter (HOSPITAL_COMMUNITY): Payer: Self-pay | Admitting: *Deleted

## 2016-11-20 ENCOUNTER — Emergency Department (HOSPITAL_COMMUNITY)
Admission: EM | Admit: 2016-11-20 | Discharge: 2016-11-20 | Disposition: A | Discharge status: Home / Self Care | Payer: Self-pay | Attending: Emergency Medicine | Admitting: Emergency Medicine

## 2016-11-20 DIAGNOSIS — R0789 Other chest pain: Secondary | ICD-10-CM | POA: Insufficient documentation

## 2016-11-20 DIAGNOSIS — Y92511 Restaurant or cafe as the place of occurrence of the external cause: Secondary | ICD-10-CM | POA: Insufficient documentation

## 2016-11-20 DIAGNOSIS — R51 Headache: Secondary | ICD-10-CM | POA: Insufficient documentation

## 2016-11-20 DIAGNOSIS — Y939 Activity, unspecified: Secondary | ICD-10-CM | POA: Insufficient documentation

## 2016-11-20 DIAGNOSIS — S0990XA Unspecified injury of head, initial encounter: Secondary | ICD-10-CM | POA: Insufficient documentation

## 2016-11-20 DIAGNOSIS — T07XXXA Unspecified multiple injuries, initial encounter: Secondary | ICD-10-CM | POA: Insufficient documentation

## 2016-11-20 DIAGNOSIS — F0781 Postconcussional syndrome: Secondary | ICD-10-CM | POA: Insufficient documentation

## 2016-11-20 DIAGNOSIS — Y998 Other external cause status: Secondary | ICD-10-CM | POA: Insufficient documentation

## 2016-11-20 DIAGNOSIS — F17210 Nicotine dependence, cigarettes, uncomplicated: Secondary | ICD-10-CM | POA: Insufficient documentation

## 2016-11-20 MED ORDER — NAPROXEN 500 MG PO TABS: 500.0000 mg | ORAL_TABLET | Freq: Two times a day (BID) | ORAL | 0 refills | Status: DC

## 2016-11-20 MED ORDER — CYCLOBENZAPRINE HCL 10 MG PO TABS: 10.0000 mg | ORAL_TABLET | Freq: Two times a day (BID) | ORAL | 0 refills | Status: DC | PRN

## 2016-11-20 NOTE — ED Notes (Signed)
Reports R shoulder only hurts when patient extends above head level. R chest pain only experienced with cough, sneeze or deep inspiration.
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
## 2016-11-21 ENCOUNTER — Encounter (HOSPITAL_COMMUNITY): Payer: Self-pay | Admitting: Emergency Medicine

## 2016-11-21 ENCOUNTER — Emergency Department (HOSPITAL_COMMUNITY)
Admission: EM | Admit: 2016-11-21 | Discharge: 2016-11-22 | Disposition: A | Discharge status: Other Acute Inpatient Hospital | Payer: Medicaid Other | Attending: Emergency Medicine | Admitting: Emergency Medicine

## 2016-11-21 DIAGNOSIS — R45851 Suicidal ideations: Secondary | ICD-10-CM | POA: Insufficient documentation

## 2016-11-21 DIAGNOSIS — F17210 Nicotine dependence, cigarettes, uncomplicated: Secondary | ICD-10-CM | POA: Insufficient documentation

## 2016-11-21 LAB — COMPREHENSIVE METABOLIC PANEL
ALBUMIN: 3.5 g/dL (ref 3.5–5.0)
ALK PHOS: 58 U/L (ref 38–126)
ALT: 10 U/L — AB (ref 17–63)
ANION GAP: 6 (ref 5–15)
AST: 15 U/L (ref 15–41)
BUN: 11 mg/dL (ref 6–20)
CALCIUM: 8.8 mg/dL — AB (ref 8.9–10.3)
CO2: 26 mmol/L (ref 22–32)
Chloride: 108 mmol/L (ref 101–111)
Creatinine, Ser: 1.05 mg/dL (ref 0.61–1.24)
GFR calc Af Amer: >60 mL/min (ref >60)
GFR calc non Af Amer: >60 mL/min (ref >60)
GLUCOSE: 121 mg/dL — AB (ref 65–99)
Potassium: 4.1 mmol/L (ref 3.5–5.1)
SODIUM: 140 mmol/L (ref 135–145)
Total Bilirubin: 0.4 mg/dL (ref 0.3–1.2)
Total Protein: 7.4 g/dL (ref 6.5–8.1)

## 2016-11-21 LAB — CBC
HEMATOCRIT: 29 % — AB (ref 39.0–52.0)
HEMOGLOBIN: 8.5 g/dL — AB (ref 13.0–17.0)
MCH: 19.5 pg — ABNORMAL LOW (ref 26.0–34.0)
MCHC: 29.3 g/dL — AB (ref 30.0–36.0)
MCV: 66.7 fL — ABNORMAL LOW (ref 78.0–100.0)
Platelets: 88 10*3/uL — ABNORMAL LOW (ref 150–400)
RBC: 4.35 MIL/uL (ref 4.22–5.81)
RDW: 27.3 % — ABNORMAL HIGH (ref 11.5–15.5)
WBC: 7.8 10*3/uL (ref 4.0–10.5)

## 2016-11-21 LAB — SALICYLATE LEVEL: Salicylate Lvl: <7 mg/dL (ref 2.8–30.0)

## 2016-11-21 LAB — ETHANOL: Alcohol, Ethyl (B): <5 mg/dL (ref <5)

## 2016-11-21 LAB — RAPID URINE DRUG SCREEN, HOSP PERFORMED
Amphetamines: NOT DETECTED
BARBITURATES: NOT DETECTED
Benzodiazepines: NOT DETECTED
COCAINE: POSITIVE — AB
Opiates: NOT DETECTED
TETRAHYDROCANNABINOL: POSITIVE — AB

## 2016-11-21 LAB — IRON AND TIBC
IRON: 30 ug/dL — AB (ref 45–182)
Saturation Ratios: 6 % — ABNORMAL LOW (ref 17.9–39.5)
TIBC: 466 ug/dL — ABNORMAL HIGH (ref 250–450)
UIBC: 436 ug/dL

## 2016-11-21 LAB — VITAMIN B12: Vitamin B-12: 391 pg/mL (ref 180–914)

## 2016-11-21 LAB — FERRITIN: FERRITIN: 4 ng/mL — AB (ref 24–336)

## 2016-11-21 LAB — ACETAMINOPHEN LEVEL

## 2016-11-21 LAB — FOLATE: Folate: 10.3 ng/mL (ref >5.9)

## 2016-11-21 LAB — RETICULOCYTES
RBC.: 4.4 MIL/uL (ref 4.22–5.81)
RETIC COUNT ABSOLUTE: 17.6 10*3/uL — AB (ref 19.0–186.0)
Retic Ct Pct: 0.4 % (ref 0.4–3.1)

## 2016-11-21 NOTE — ED Notes (Signed)
Sprite given to patient.
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
## 2016-11-21 NOTE — ED Notes (Signed)
One pt belongings bag placed in [employer]. One valuables envelope with Security.

## 2016-11-21 NOTE — ED Notes (Signed)
Meal tray to be ordered

## 2016-11-21 NOTE — BH Assessment (Signed)
[facility] Assessment Progress Note    Per DNP Celso AmyJamie Lord, patient qualifies for inpatient treatment.

## 2016-11-21 NOTE — ED Notes (Signed)
Pt dressed out in scrubs and wanded by security.  Charge RN was made aware of needs.

## 2016-11-21 NOTE — ED Notes (Signed)
Belongings inventoried and secured with security.

## 2016-11-21 NOTE — ED Notes (Signed)
TTS Consulting at bedside

## 2016-11-21 NOTE — ED Triage Notes (Signed)
Pt states he is depressed and has had thoughts of SI for one week. Reports home life with his wife and job have been stressful. Pt is tearful at triage but contracts for safety with this RN. States that he did stand on the bridge looking down. Pt is alert and oriented. He was notified of our procedures for wanding and dressing out in our scrubs for safety. Pt agrees. Vitals stable.

## 2016-11-21 NOTE — ED Notes (Signed)
Pt being dressed out in scrubs
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
## 2016-11-22 ENCOUNTER — Inpatient Hospital Stay (HOSPITAL_COMMUNITY)
Admission: AD | Admit: 2016-11-22 | Discharge: 2016-11-25 | DRG: 885 | Disposition: A | Discharge status: Home / Self Care | Payer: BLUE CROSS/BLUE SHIELD | Source: Intra-hospital | Attending: Psychiatry | Admitting: Psychiatry

## 2016-11-22 ENCOUNTER — Encounter (HOSPITAL_COMMUNITY): Payer: Self-pay

## 2016-11-22 DIAGNOSIS — R45851 Suicidal ideations: Secondary | ICD-10-CM

## 2016-11-22 DIAGNOSIS — Z791 Long term (current) use of non-steroidal anti-inflammatories (NSAID): Secondary | ICD-10-CM

## 2016-11-22 DIAGNOSIS — Z560 Unemployment, unspecified: Secondary | ICD-10-CM | POA: Diagnosis not present

## 2016-11-22 DIAGNOSIS — D509 Iron deficiency anemia, unspecified: Secondary | ICD-10-CM | POA: Diagnosis present

## 2016-11-22 DIAGNOSIS — F419 Anxiety disorder, unspecified: Secondary | ICD-10-CM | POA: Diagnosis present

## 2016-11-22 DIAGNOSIS — F333 Major depressive disorder, recurrent, severe with psychotic symptoms: Principal | ICD-10-CM | POA: Diagnosis present

## 2016-11-22 DIAGNOSIS — R440 Auditory hallucinations: Secondary | ICD-10-CM

## 2016-11-22 DIAGNOSIS — Z79899 Other long term (current) drug therapy: Secondary | ICD-10-CM

## 2016-11-22 DIAGNOSIS — F17210 Nicotine dependence, cigarettes, uncomplicated: Secondary | ICD-10-CM | POA: Diagnosis present

## 2016-11-22 DIAGNOSIS — Z630 Problems in relationship with spouse or partner: Secondary | ICD-10-CM

## 2016-11-22 DIAGNOSIS — F1910 Other psychoactive substance abuse, uncomplicated: Secondary | ICD-10-CM | POA: Diagnosis not present

## 2016-11-22 DIAGNOSIS — L511 Stevens-Johnson syndrome: Secondary | ICD-10-CM | POA: Diagnosis present

## 2016-11-22 DIAGNOSIS — F1420 Cocaine dependence, uncomplicated: Secondary | ICD-10-CM | POA: Diagnosis present

## 2016-11-22 DIAGNOSIS — F1020 Alcohol dependence, uncomplicated: Secondary | ICD-10-CM | POA: Diagnosis present

## 2016-11-22 DIAGNOSIS — G4700 Insomnia, unspecified: Secondary | ICD-10-CM

## 2016-11-22 DIAGNOSIS — F1290 Cannabis use, unspecified, uncomplicated: Secondary | ICD-10-CM | POA: Diagnosis not present

## 2016-11-22 HISTORY — DX: Anemia, unspecified: D64.9

## 2016-11-22 MED ORDER — METHOCARBAMOL 500 MG PO TABS
1000.0000 mg | ORAL_TABLET | Freq: Four times a day (QID) | ORAL | Status: DC | PRN
  Administered 2016-11-22 – 2016-11-24 (×3): 1000 mg via ORAL
  Filled 2016-11-22 (×3): qty 2

## 2016-11-22 MED ORDER — SERTRALINE HCL 25 MG PO TABS
25.0000 mg | ORAL_TABLET | Freq: Every day | ORAL | Status: DC
  Filled 2016-11-22 (×6): qty 1

## 2016-11-22 MED ORDER — ALUM & MAG HYDROXIDE-SIMETH 200-200-20 MG/5ML PO SUSP: 30.0000 mL | ORAL | Status: DC | PRN

## 2016-11-22 MED ORDER — NAPROXEN 500 MG PO TABS
500.0000 mg | ORAL_TABLET | Freq: Two times a day (BID) | ORAL | Status: DC
  Administered 2016-11-22 – 2016-11-25 (×6): 500 mg via ORAL
  Filled 2016-11-22 (×12): qty 1

## 2016-11-22 MED ORDER — MAGNESIUM HYDROXIDE 400 MG/5ML PO SUSP
30.0000 mL | Freq: Every day | ORAL | Status: DC | PRN
Start: 2016-11-22 — End: 2016-11-25

## 2016-11-22 MED ORDER — TRAZODONE HCL 50 MG PO TABS
50.0000 mg | ORAL_TABLET | Freq: Every evening | ORAL | Status: DC | PRN
  Filled 2016-11-22 (×10): qty 1

## 2016-11-22 MED ORDER — ENSURE ENLIVE PO LIQD
237.0000 mL | Freq: Two times a day (BID) | ORAL | Status: DC
  Administered 2016-11-22 – 2016-11-25 (×5): 237 mL via ORAL

## 2016-11-22 MED ORDER — ACETAMINOPHEN 325 MG PO TABS
650.0000 mg | ORAL_TABLET | Freq: Four times a day (QID) | ORAL | Status: DC | PRN
  Administered 2016-11-22 – 2016-11-24 (×2): 650 mg via ORAL
  Filled 2016-11-22 (×2): qty 2

## 2016-11-22 MED ORDER — FERROUS SULFATE 325 (65 FE) MG PO TABS
325.0000 mg | ORAL_TABLET | Freq: Two times a day (BID) | ORAL | Status: DC
Start: 2016-11-22 — End: 2016-11-25
  Administered 2016-11-22 – 2016-11-25 (×6): 325 mg via ORAL
  Filled 2016-11-22 (×11): qty 1

## 2016-11-22 NOTE — ED Notes (Signed)
EMTALA reviewed by this RN - just need to add section "copies of medical record sent"

## 2016-11-22 NOTE — Progress Notes (Signed)
Adult Psychoeducational Group Note  Date:  11/22/2016 Time:  9:56 PM  Group Topic/Focus:  Wrap-Up Group:   The focus of this group is to help patients review their daily goal of treatment and discuss progress on daily workbooks.  Participation Level:  Active  Participation Quality:  Appropriate  Affect:  Appropriate  Cognitive:  Alert and Appropriate  Insight: Appropriate  Engagement in Group:  Engaged  Modes of Intervention:  Discussion  Additional Comments:  Pt stated his goal for today was to focus on his treatment here. Pt stated he accomplished his goal and felt good about it. Pt rated his over all day a 10. Pt stated he attended all groups held today.  Felipa FurnaceChristopher  Yovanna Cogan 11/22/2016, 9:56 PM

## 2016-11-22 NOTE — [facility] Suicide Risk Assessment (Signed)
[facility]   Nursing information obtained from:  Patient Demographic factors:  Male, Divorced or widowed, Low socioeconomic status, Unemployed, Access to firearms Current Mental Status:  Suicidal ideation indicated by patient, Self-harm thoughts Loss Factors:  Decrease in vocational status, Financial problems / change in socioeconomic status, Decline in physical health, Loss of significant relationship Historical Factors:  NA Risk Reduction Factors:  Positive social support, Positive therapeutic relationship  Total Time spent with patient: 45 minutes Principal Problem: MDD (major depressive disorder), recurrent, severe, with psychosis (HCC) Diagnosis:   Patient Active Problem List   Diagnosis Date Noted  . MDD (major depressive disorder), recurrent, severe, with psychosis (HCC) [F33.3] 11/22/2016   Subjective Data: patient is 41 year old African-American man who was admitted due to severe depression and having thoughts of suicide.  He mentioned relationship issues with her life and recently lost his job.  He admitted using drugs.  Currently he is not taking any medication.  He endorse having suicidal thoughts the past few days and plan to jump off from the bridge.  He admitted some time hallucinations but denies since he came to the unit.  His been separated from his wife 3 weeks ago.  Patient admitted feeling hopeless helpless worthless and anhedonia and has no motivation to do many things.  His UDS is positive for cocaine and cannabis.  Patient requires inpatient treatment and stabilization.  Continued Clinical Symptoms:  Alcohol Use Disorder Identification Test Final Score (AUDIT): 25 The "Alcohol Use Disorders Identification Test", Guidelines for Use in Primary Care, Second Edition.  World Science writerHealth Organization [facility](WHO). Score between 0-7:  no or low risk or alcohol related problems. Score between 8-15:  moderate risk of alcohol related problems. Score between 16-19:   high risk of alcohol related problems. Score 20 or above:  warrants further diagnostic evaluation for alcohol dependence and treatment.   CLINICAL FACTORS:   Depression:   Comorbid alcohol abuse/dependence Hopelessness Impulsivity Insomnia Severe Alcohol/Substance Abuse/Dependencies Unstable or Poor Therapeutic Relationship   Musculoskeletal: Strength & Muscle Tone: within normal limits Gait & Station: normal Patient leans: N/A  Psychiatric Specialty Exam: Physical Exam  ROS  Blood pressure 99/77, pulse 71, temperature 97.7 F (36.5 C), temperature source Oral, resp. rate 20, height 5\' 6"  (1.676 m), weight 52.2 kg (115 lb), SpO2 100 %.Body mass index is 18.56 kg/m.  General Appearance: Fairly Groomed and Guarded  Eye Contact:  Fair  Speech:  Slow  Volume:  Decreased  Mood:  Depressed, Dysphoric, Hopeless and Irritable  Affect:  Constricted and Depressed  Thought Process:  Goal Directed  Orientation:  Full (Time, Place, and Person)  Thought Content:  Hallucinations: Auditory, Paranoid Ideation and Rumination  Suicidal Thoughts:  Yes.  with intent/plan  Homicidal Thoughts:  No  Memory:  Immediate;   Good Recent;   Good Remote;   Good  Judgement:  Impaired  Insight:  Lacking  Psychomotor Activity:  Decreased  Concentration:  Concentration: Fair and Attention Span: Fair  Recall:  Good  Fund of Knowledge:  Good  Language:  Fair  Akathisia:  No  Handed:  Right  AIMS (if indicated):     Assets:  Communication Skills Desire for Improvement  ADL's:  Intact  Cognition:  WNL  Sleep:  Number of Hours: 3.25      COGNITIVE FEATURES THAT CONTRIBUTE TO RISK:  Loss of executive function, Polarized thinking and Thought constriction (tunnel vision)    SUICIDE RISK:   Moderate:  Frequent suicidal ideation with limited  intensity, and duration, some specificity in terms of plans, no associated intent, good self-control, limited dysphoria/symptomatology, some risk factors  present, and identifiable protective factors, including available and accessible social support.  PLAN OF CARE: patient is 41 year old African-American man who was admitted due to severe depression and having suicidal thoughts and plan to jump off from the bridge.  Patient also positive for cocaine and cannabis.  Patient requires inpatient treatment and stabilization.  Please see history and physical for more detailed plan.  I certify that inpatient services furnished can reasonably be expected to improve the patient's condition.   Diyan Dave T., MD 11/22/2016, 11:12 AM

## 2016-11-22 NOTE — Tx Team (Signed)
Initial Treatment Plan 11/22/2016 2:12 AM John Moody RUE:[id number]    PATIENT STRESSORS: Financial difficulties Health problems Marital or family conflict Substance abuse   PATIENT STRENGTHS: Ability for insight Active sense of humor Average or above average intelligence Capable of independent living Motivation for treatment/growth   PATIENT IDENTIFIED PROBLEMS: "Get off drugs and alcohol"  "Find a job"  Substance Abuse  Suicide Risk               DISCHARGE CRITERIA:  Ability to meet basic life and health needs Adequate post-discharge living arrangements Improved stabilization in mood, thinking, and/or behavior Medical problems require only outpatient monitoring Motivation to continue treatment in a less acute level of care Need for constant or close observation no longer present Reduction of life-threatening or endangering symptoms to within safe limits Safe-care adequate arrangements made Verbal commitment to aftercare and medication compliance Withdrawal symptoms are absent or subacute and managed without 24-hour nursing intervention  PRELIMINARY DISCHARGE PLAN: Outpatient therapy  PATIENT/FAMILY INVOLVEMENT: This treatment plan has been presented to and reviewed with the patient, John GingerWalter R Moody.  The patient and family have been given the opportunity to ask questions and make suggestions.  Ferrel LoganAmanda A Jozalyn Baglio, RN 11/22/2016, 2:12 AM
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
## 2016-11-22 NOTE — Progress Notes (Signed)
DAR NOTE: Patient presents with anxious affect and depressed mood.  Denies pain, auditory and visual hallucinations.  Rates depression at 5, hopelessness at 6, and anxiety at 5.  Maintained on routine safety checks.  Refused Zoloft this morning.  RNP made aware.  Support and encouragement offered as needed.  Attended group and participated.  States goal for today is "getting sober."  Patient remained in his room most of this shift.  Tylenol 650 mg given for complain of headache with good effect.
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
## 2016-11-22 NOTE — ED Notes (Signed)
Left with [facility] at this time.  All belongings sent with transport including things that were locked with security.

## 2016-11-22 NOTE — H&P (Signed)
Psychiatric Admission Assessment Adult  Patient Identification: John Moody MRN:  [id number] Date of Evaluation:  11/22/2016 Chief Complaint:  MDD with psychotic features  polysubstance abuse  Principal Diagnosis: MDD (major depressive disorder), recurrent, severe, with psychosis (HCC) Diagnosis:   Patient Active Problem List   Diagnosis Date Noted  . MDD (major depressive disorder), recurrent, severe, with psychosis (HCC) [F33.3] 11/22/2016   History of Present Illness:  Per admission assessment-John Moody is an 41 y.o. male. Self admission to [facility] ED with depression and thoughts of suicide for the past week. Pt. Reports relationship complications with wife and lost of job has been stressful. Pt. SI with suicidal intent to jump off a bridge. Pt. Denies SI plan. Pt. Reports hearing voices telling him 'people will be better w/o him.' pt. Denies HI. Per RN pt. Stated he stood on the bridge looking.   Pt. Presented with symptoms of depression consistent with sadness, tearful, hopelessness, lost of appetite and lack of sleep. Pt. Reported separating from his wife 3 weeks ago and losing his job shortly afterwards due to lack of attendance. Pt. Reported depressive symptoms has increased over the past few weeks. Pt. Reported negative coping skills of assuming alcohol and cocaine daily for the past 3 weeks, has a history of substance abuse starting at age 77 y/o.   Pt. Reported suicidal thoughts for the past week and auditory voices. Pt. Could not contract for safety. Pt. Denies SI plan, HI and A/V hallucinations.   Pt. Affect was depressed, mood depressed, crying, and poor eye contact. Speech normal, level of consciousness coherent, and judgement impaired. Pt. Reports not eating or sleeping for the past few weeks.  Pt. Declined wanting anyone notified. No past history of inpatient or outpatient services. No past history of mental health, denies past history of SI attempts, HI, and A/V  hallucinations.    On Evaluation: John Moody is awake, alert and oriented. 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  Denies suicidal or homicidal ideation during this assessment. Denies auditory or visual hallucination and does not appear to be responding to internal stimuli. Patient repost I just needed to talk to somebody.  Support, encouragement and reassurance was provided.   Associated Signs/Symptoms: Depression Symptoms:  depressed mood, hopelessness, anxiety, (Hypo) Manic Symptoms:  Irritable Mood, Anxiety Symptoms:  Excessive Worry, Psychotic Symptoms:  Hallucinations: Auditory PTSD Symptoms: Avoidance:  Decreased Interest/Participation Total Time spent with patient: 30 minutes  Past Psychiatric History:  Is the patient at risk to self? Yes.    Has the patient been a risk to self in the past 6 months? Yes.    Has the patient been a risk to self within the distant past? Yes.    Is the patient a risk to others? No.  Has the patient been a risk to others in the past 6 months? No.  Has the patient been a risk to others within the distant past? No.   Prior Inpatient Therapy:   Prior Outpatient Therapy:    Alcohol Screening: 1. How often do you have a drink containing alcohol?: 4 or more times a week 2. How many drinks containing alcohol do you have on a typical day when you are drinking?: 5 or 6 3. How often do you have six or more drinks on one occasion?: Less than monthly Preliminary Score: 3 4. How often during the last year have you found that you were  not able to stop drinking once you had started?: Monthly 5. How often during the last year have you failed to do what was normally expected from you becasue of drinking?: Monthly 6. How often during the last year have you needed a first drink in the morning to get yourself going after a heavy drinking session?: Never 7. How often during the last year have you had a  feeling of guilt of remorse after drinking?: Daily or almost daily 8. How often during the last year have you been unable to remember what happened the night before because you had been drinking?: Daily or almost daily 9. Have you or someone else been injured as a result of your drinking?: Yes, but not in the last year 10. Has a relative or friend or a doctor or another health worker been concerned about your drinking or suggested you cut down?: Yes, during the last year Alcohol Use Disorder Identification Test Final Score (AUDIT): 25 Brief Intervention: Yes Substance Abuse History in the last 12 months:  Yes.   Consequences of Substance Abuse: NA Previous Psychotropic Medications: no Psychological Evaluations: no Past Medical History:  Past Medical History:  Diagnosis Date  . Anemia   . 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 No pertinent surgical history. Family History: History reviewed. No pertinent family history. Family Psychiatric  History:  Tobacco Screening: Have you used any form of tobacco in the last 30 days? (Cigarettes, Smokeless Tobacco, Cigars, and/or Pipes): Yes Tobacco use, Select all that apply: 4 or less cigarettes per day Are you interested in Tobacco Cessation Medications?: No, patient refused Counseled patient on smoking cessation including recognizing danger situations, developing coping skills and basic information about quitting provided: Yes Social History:  History  Alcohol Use  . 21.0 oz/week  . 35 Cans of beer per week     History  Drug Use  . Types: Marijuana, Cocaine    Additional Social History:                           Allergies:  No Known Allergies Lab Results:  Results for orders placed or performed during the hospital encounter of 11/21/16 (from the past 48 hour(s))  Comprehensive metabolic panel     Status: Abnormal   Collection Time: 11/21/16 12:25 PM  Result Value Ref Range   Sodium 140 135 - 145 mmol/L    Potassium 4.1 3.5 - 5.1 mmol/L   Chloride 108 101 - 111 mmol/L   CO2 26 22 - 32 mmol/L   Glucose, Bld 121 (H) 65 - 99 mg/dL   BUN 11 6 - 20 mg/dL   Creatinine, Ser 1.05 0.61 - 1.24 mg/dL   Calcium 8.8 (L) 8.9 - 10.3 mg/dL   Total Protein 7.4 6.5 - 8.1 g/dL   Albumin 3.5 3.5 - 5.0 g/dL   AST 15 15 - 41 U/L   ALT 10 (L) 17 - 63 U/L   Alkaline Phosphatase 58 38 - 126 U/L   Total Bilirubin 0.4 0.3 - 1.2 mg/dL   GFR calc non Af Amer >60 >60 mL/min   GFR calc Af Amer >60 >60 mL/min    Comment: (NOTE) The eGFR has been calculated using the CKD EPI equation. This calculation has not been validated in all clinical situations. eGFR's persistently <60 mL/min signify possible Chronic Kidney Disease.    Anion gap 6 5 - 15  Ethanol     Status: None  Collection Time: 11/21/16 12:25 PM  Result Value Ref Range   Alcohol, Ethyl (B) <5 <5 mg/dL    Comment:        LOWEST DETECTABLE LIMIT FOR SERUM ALCOHOL IS 5 mg/dL FOR MEDICAL PURPOSES ONLY   Salicylate level     Status: None   Collection Time: 11/21/16 12:25 PM  Result Value Ref Range   Salicylate Lvl <6.3 2.8 - 30.0 mg/dL  Acetaminophen level     Status: Abnormal   Collection Time: 11/21/16 12:25 PM  Result Value Ref Range   Acetaminophen (Tylenol), Serum <10 (L) 10 - 30 ug/mL    Comment:        THERAPEUTIC CONCENTRATIONS VARY SIGNIFICANTLY. A RANGE OF 10-30 ug/mL MAY BE AN EFFECTIVE CONCENTRATION FOR MANY PATIENTS. HOWEVER, SOME ARE BEST TREATED AT CONCENTRATIONS OUTSIDE THIS RANGE. ACETAMINOPHEN CONCENTRATIONS >150 ug/mL AT 4 HOURS AFTER INGESTION AND >50 ug/mL AT 12 HOURS AFTER INGESTION ARE OFTEN ASSOCIATED WITH TOXIC REACTIONS.   cbc     Status: Abnormal   Collection Time: 11/21/16 12:25 PM  Result Value Ref Range   WBC 7.8 4.0 - 10.5 K/uL   RBC 4.35 4.22 - 5.81 MIL/uL   Hemoglobin 8.5 (L) 13.0 - 17.0 g/dL   HCT 29.0 (L) 39.0 - 52.0 %   MCV 66.7 (L) 78.0 - 100.0 fL   MCH 19.5 (L) 26.0 - 34.0 pg   MCHC 29.3 (L) 30.0  - 36.0 g/dL   RDW 27.3 (H) 11.5 - 15.5 %   Platelets 88 (L) 150 - 400 K/uL    Comment: PLATELET COUNT CONFIRMED BY SMEAR  Reticulocytes     Status: Abnormal   Collection Time: 11/21/16 12:25 PM  Result Value Ref Range   Retic Ct Pct 0.4 0.4 - 3.1 %   RBC. 4.40 4.22 - 5.81 MIL/uL   Retic Count, Absolute 17.6 (L) 19.0 - 186.0 K/uL  Rapid urine drug screen (hospital performed)     Status: Abnormal   Collection Time: 11/21/16  3:20 PM  Result Value Ref Range   Opiates NONE DETECTED NONE DETECTED   Cocaine POSITIVE (A) NONE DETECTED   Benzodiazepines NONE DETECTED NONE DETECTED   Amphetamines NONE DETECTED NONE DETECTED   Tetrahydrocannabinol POSITIVE (A) NONE DETECTED   Barbiturates NONE DETECTED NONE DETECTED    Comment:        DRUG SCREEN FOR MEDICAL PURPOSES ONLY.  IF CONFIRMATION IS NEEDED FOR ANY PURPOSE, NOTIFY LAB WITHIN 5 DAYS.        LOWEST DETECTABLE LIMITS FOR URINE DRUG SCREEN Drug Class       Cutoff (ng/mL) Amphetamine      1000 Barbiturate      200 Benzodiazepine   875 Tricyclics       643 Opiates          300 Cocaine          300 THC              50   Vitamin B12     Status: None   Collection Time: 11/21/16  3:40 PM  Result Value Ref Range   Vitamin B-12 391 180 - 914 pg/mL    Comment: (NOTE) This assay is not validated for testing neonatal or myeloproliferative syndrome specimens for Vitamin B12 levels.   Folate     Status: None   Collection Time: 11/21/16  3:40 PM  Result Value Ref Range   Folate 10.3 >5.9 ng/mL  Iron and TIBC     Status:  Abnormal   Collection Time: 11/21/16  3:40 PM  Result Value Ref Range   Iron 30 (L) 45 - 182 ug/dL   TIBC 466 (H) 250 - 450 ug/dL   Saturation Ratios 6 (L) 17.9 - 39.5 %   UIBC 436 ug/dL  Ferritin     Status: Abnormal   Collection Time: 11/21/16  3:40 PM  Result Value Ref Range   Ferritin 4 (L) 24 - 336 ng/mL    Blood Alcohol level:  Lab Results  Component Value Date   ETH <5 11/21/2016   [facility]  07/29/2009     <5        LOWEST DETECTABLE LIMIT FOR SERUM ALCOHOL IS 5 mg/dL FOR MEDICAL PURPOSES ONLY    Metabolic Disorder Labs:  No results found for: HGBA1C, MPG No results found for: PROLACTIN No results found for: CHOL, TRIG, HDL, CHOLHDL, VLDL, LDLCALC  Current Medications: Current Facility-Administered Medications  Medication Dose Route Frequency Provider Last Rate Last Dose  . acetaminophen (TYLENOL) tablet 650 mg  650 mg Oral Q6H PRN Rankin, Shuvon B, NP      . alum & mag hydroxide-simeth (MAALOX/MYLANTA) 200-200-20 MG/5ML suspension 30 mL  30 mL Oral Q4H PRN Rankin, Shuvon B, NP      . feeding supplement (ENSURE ENLIVE) (ENSURE ENLIVE) liquid 237 mL  237 mL Oral BID BM Eappen, Saramma, MD      . magnesium hydroxide (MILK OF MAGNESIA) suspension 30 mL  30 mL Oral Daily PRN Rankin, Shuvon B, NP       PTA Medications: Prescriptions Prior to Admission  Medication Sig Dispense Refill Last Dose  . cyclobenzaprine (FLEXERIL) 10 MG tablet Take 10 mg by mouth 2 (two) times daily.     . naproxen (NAPROSYN) 500 MG tablet Take 500 mg by mouth 2 (two) times daily.       Musculoskeletal: Strength & Muscle Tone: within normal limits Gait & Station: normal Patient leans: N/A  Psychiatric Specialty Exam: Physical Exam  ROS  Blood pressure 99/77, pulse 71, temperature 97.7 F (36.5 C), temperature source Oral, resp. rate 20, height $RemoveBe'5\' 6"'TLNOLPEsn$  (1.676 m), weight 52.2 kg (115 lb), SpO2 100 %.Body mass index is 18.56 kg/m.  General Appearance: Casual  Eye Contact:  Good  Speech:  Clear and Coherent  Volume:  Normal  Mood:  Depressed  Affect:  Depressed and Flat  Thought Process:  Coherent  Orientation:  Full (Time, Place, and Person)  Thought Content:  Hallucinations: Auditory and Rumination  Suicidal Thoughts:  Yes.  with intent/plan denies during this assessment    Homicidal Thoughts:  No  Memory:  Immediate;   Fair Recent;   Fair Remote;   Fair  Judgement:  Intact  Insight:  Fair   Psychomotor Activity:  Restlessness  Concentration:  Concentration: Fair  Recall:  [employer] of Knowledge:  Fair  Language:  Good  Akathisia:  No  Handed:  Right  AIMS (if indicated):     Assets:  Communication Skills Desire for Improvement Resilience Social Support  ADL's:  Intact  Cognition:  WNL  Sleep:  Number of Hours: 3.25     I agree with current treatment plan on 11/22/2016, Patient seen face-to-face for psychiatric evaluation follow-up, chart reviewed. Reviewed the information documented and agree with the treatment plan.  Treatment Plan Summary: Daily contact with patient to assess and evaluate symptoms and progress in treatment and Medication management   Start  with Zoloft 25 mg  for mood stabilization. With  titration  Start with Trazodone 50 mg for insomnia Initiated  ferrouse 325 mg PO BID  Will continue to monitor vitals ,medication compliance and treatment side effects while patient is here.   Reviewed labs Ferritin 4, Iron and TIBC is (decreased) :BAL - UDS - pos for cocaine, thc. CSW will start working on disposition.  Patient to participate in therapeutic milieu   Observation Level/Precautions:  15 minute checks  Laboratory:  CBC HbAIC UDS  Psychotherapy:  Individual and group session  Medications:  See SRA by MD  Consultations:  Psychiatry  Discharge Concerns:  Safety, stabilization, and risk of access to medication and medication stabilization   Estimated LOS: 5-7 days   Other:     Physician Treatment Plan for Primary Diagnosis: MDD (major depressive disorder), recurrent, severe, with psychosis ([place]) Long Term Goal(s): Improvement in symptoms so as ready for discharge  Short Term Goals: Ability to identify changes in lifestyle to reduce recurrence of condition will improve, Ability to disclose and discuss suicidal ideas and Compliance with prescribed medications will improve  Physician Treatment Plan for Secondary Diagnosis: Principal  Problem:   MDD (major depressive disorder), recurrent, severe, with psychosis ([place])  Long Term Goal(s): Improvement in symptoms so as ready for discharge  Short Term Goals: Ability to verbalize feelings will improve, Ability to disclose and discuss suicidal ideas and Compliance with prescribed medications will improve  I certify that inpatient services furnished can reasonably be expected to improve the patient's condition.    Derrill Center, NP 9/8/201811:00 AM

## 2016-11-22 NOTE — ED Provider Notes (Signed)
Vitals:   11/21/16 1151 11/21/16 1718  BP: 93/76 108/75  Pulse: 67 78  Resp: 18 16  Temp: 98.9 F (37.2 C)   SpO2: 99% 100%   Accepted for transfer.  Vitals stable   Linwood DibblesKnapp, Tawnee Clegg, MD 11/22/16 [phone]

## 2016-11-22 NOTE — [facility] Group Notes (Signed)
[facility]  Date/Time:    11/22/2016 11:15AM-12:00PM  Type of Therapy and Topic:  Group Therapy:  Healthy vs Unhealthy Coping Skills  Participation Level:  Active   Description of Group:  The focus of this group was to determine what unhealthy coping techniques typically are used by group members and what healthy coping techniques would be helpful in coping with various problems. Patients were guided in becoming aware of the differences between healthy and unhealthy coping techniques.  Patients were asked to identify 1-2 healthy coping skills they would like to learn to use more effectively, and many mentioned meditation, breathing, and relaxation.  A patient was angry about being involuntarily committed, talked about punching holes in walls in order to get himself released "like last time, that's what it took."  The group was asked to help brainstorm other healthier coping skills to deal with frustration.  Therapeutic Goals 1. Patients learned that coping is what human beings do all day long to deal with various situations in their lives 2. Patients defined and discussed healthy vs unhealthy coping techniques 3. Patients identified their preferred coping techniques and identified whether these were healthy or unhealthy 4. Patients determined 1-2 healthy coping skills they would like to become more familiar with 5. Patients provided support and ideas to each other  Summary of Patient Progress: During group, patient expressed little but was attentive and spontaneous in his responses to others' comments.  Therapeutic Modalities Cognitive Behavioral Therapy Motivational Interviewing  Ambrose MantleMareida Grossman-Orr, LCSW 11/22/2016, 3:58 PM

## 2016-11-22 NOTE — Plan of Care (Signed)
Problem: Safety: Goal: Periods of time without injury will increase Outcome: Progressing Patient is on q15 minute safety checks and low fall risk precautions. Patient contracts for safety on the unit and remains safe at this time.   

## 2016-11-23 DIAGNOSIS — Z79899 Other long term (current) drug therapy: Secondary | ICD-10-CM

## 2016-11-23 NOTE — [facility] Group Notes (Signed)
[facility] LCSW Group Therapy Note  Date/Time:  11/23/2016  11:00AM-12:00PM  Type of Therapy and Topic:  Group Therapy:  Music and Mood  Participation Level:  Did Not Attend   Description of Group: In this process group, members listened to a variety of genres of music and identified that different types of music evoke different responses.  Patients were encouraged to identify music that was soothing for them and music that was energizing for them.  Patients discussed how this knowledge can help with wellness and recovery in various ways including managing depression and anxiety as well as encouraging healthy sleep habits.    Therapeutic Goals: 1. Patients will explore the impact of different varieties of music on mood 2. Patients will verbalize the thoughts they have when listening to different types of music 3. Patients will identify music that is soothing to them as well as music that is energizing to them 4. Patients will discuss how to use this knowledge to assist in maintaining wellness and recovery 5. Patients will explore the use of music as a coping skill  Summary of Patient Progress:  N/A  Therapeutic Modalities: Solution Focused Brief Therapy Motivational Interviewing Activity   John MantleMareida Grossman-Orr, LCSW 11/23/2016 8:40 AM

## 2016-11-23 NOTE — Progress Notes (Signed)
[facility] MD Progress Note  11/23/2016 12:09 PM John Moody  MRN:  [id number] Subjective:  Patient reports " I am alright"  Objective: John Moody is awake, alert and oriented. Seen resting in bed. Continues to present with a flat an guarded affect. Reports he is not interested in taking any medication at this time. Per staffing notes patient is isolative to his room and hasn't attending group session. Denies suicidal or homicidal ideation. Denies auditory or visual hallucination and does not appear to be responding to internal stimuli.   Reports good appetite and states he is  resting well. Support, encouragement and reassurance was provided.   Principal Problem: MDD (major depressive disorder), recurrent, severe, with psychosis ([place]) Diagnosis:   Patient Active Problem List   Diagnosis Date Noted  . MDD (major depressive disorder), recurrent, severe, with psychosis ([place]) [F33.3] 11/22/2016   Total Time spent with patient: 30 minutes  Past Psychiatric History:   Past Medical History:  Past Medical History:  Diagnosis Date  . Anemia   . 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 No pertinent surgical history. Family History: History reviewed. No pertinent family history. Family Psychiatric  History:  Social History:  History  Alcohol Use  . 21.0 oz/week  . 35 Cans of beer per week     History  Drug Use  . Types: Marijuana, Cocaine    Social History   Social History  . Marital status: Legally Separated    Spouse name: N/A  . Number of children: N/A  . Years of education: N/A   Social History Main Topics  . Smoking status: Current Every Day Smoker    Packs/day: 0.50    Types: Cigarettes  . Smokeless tobacco: Never Used  . Alcohol use 21.0 oz/week    35 Cans of beer per week  . Drug use: Yes    Types: Marijuana, Cocaine  . Sexual activity: Not Asked   Other Topics Concern  . None   Social History Narrative  . None   Additional Social History:                          Sleep: Good  Appetite:  Good  Current Medications: Current Facility-Administered Medications  Medication Dose Route Frequency Provider Last Rate Last Dose  . acetaminophen (TYLENOL) tablet 650 mg  650 mg Oral Q6H PRN Rankin, Shuvon B, NP   650 mg at 11/22/16 1350  . alum & mag hydroxide-simeth (MAALOX/MYLANTA) 200-200-20 MG/5ML suspension 30 mL  30 mL Oral Q4H PRN Rankin, Shuvon B, NP      . feeding supplement (ENSURE ENLIVE) (ENSURE ENLIVE) liquid 237 mL  237 mL Oral BID BM Eappen, Saramma, MD   237 mL at 11/23/16 1149  . ferrous sulfate tablet 325 mg  325 mg Oral BID WC Derrill Center, NP   325 mg at 11/23/16 1149  . magnesium hydroxide (MILK OF MAGNESIA) suspension 30 mL  30 mL Oral Daily PRN Rankin, Shuvon B, NP      . methocarbamol (ROBAXIN) tablet 1,000 mg  1,000 mg Oral Q6H PRN Lindon Romp A, NP   1,000 mg at 11/22/16 2111  . naproxen (NAPROSYN) tablet 500 mg  500 mg Oral BID Lindon Romp A, NP   500 mg at 11/23/16 1150  . sertraline (ZOLOFT) tablet 25 mg  25 mg Oral Daily Derrill Center, NP      . traZODone (DESYREL) tablet 50 mg  50  mg Oral QHS,MR X 1 Derrill Center, NP        Lab Results:  Results for orders placed or performed during the hospital encounter of 11/21/16 (from the past 48 hour(s))  Comprehensive metabolic panel     Status: Abnormal   Collection Time: 11/21/16 12:25 PM  Result Value Ref Range   Sodium 140 135 - 145 mmol/L   Potassium 4.1 3.5 - 5.1 mmol/L   Chloride 108 101 - 111 mmol/L   CO2 26 22 - 32 mmol/L   Glucose, Bld 121 (H) 65 - 99 mg/dL   BUN 11 6 - 20 mg/dL   Creatinine, Ser 1.05 0.61 - 1.24 mg/dL   Calcium 8.8 (L) 8.9 - 10.3 mg/dL   Total Protein 7.4 6.5 - 8.1 g/dL   Albumin 3.5 3.5 - 5.0 g/dL   AST 15 15 - 41 U/L   ALT 10 (L) 17 - 63 U/L   Alkaline Phosphatase 58 38 - 126 U/L   Total Bilirubin 0.4 0.3 - 1.2 mg/dL   GFR calc non Af Amer >60 >60 mL/min   GFR calc Af Amer >60 >60 mL/min    Comment: (NOTE) The  eGFR has been calculated using the CKD EPI equation. This calculation has not been validated in all clinical situations. eGFR's persistently <60 mL/min signify possible Chronic Kidney Disease.    Anion gap 6 5 - 15  Ethanol     Status: None   Collection Time: 11/21/16 12:25 PM  Result Value Ref Range   Alcohol, Ethyl (B) <5 <5 mg/dL    Comment:        LOWEST DETECTABLE LIMIT FOR SERUM ALCOHOL IS 5 mg/dL FOR MEDICAL PURPOSES ONLY   Salicylate level     Status: None   Collection Time: 11/21/16 12:25 PM  Result Value Ref Range   Salicylate Lvl <3.6 2.8 - 30.0 mg/dL  Acetaminophen level     Status: Abnormal   Collection Time: 11/21/16 12:25 PM  Result Value Ref Range   Acetaminophen (Tylenol), Serum <10 (L) 10 - 30 ug/mL    Comment:        THERAPEUTIC CONCENTRATIONS VARY SIGNIFICANTLY. A RANGE OF 10-30 ug/mL MAY BE AN EFFECTIVE CONCENTRATION FOR MANY PATIENTS. HOWEVER, SOME ARE BEST TREATED AT CONCENTRATIONS OUTSIDE THIS RANGE. ACETAMINOPHEN CONCENTRATIONS >150 ug/mL AT 4 HOURS AFTER INGESTION AND >50 ug/mL AT 12 HOURS AFTER INGESTION ARE OFTEN ASSOCIATED WITH TOXIC REACTIONS.   cbc     Status: Abnormal   Collection Time: 11/21/16 12:25 PM  Result Value Ref Range   WBC 7.8 4.0 - 10.5 K/uL   RBC 4.35 4.22 - 5.81 MIL/uL   Hemoglobin 8.5 (L) 13.0 - 17.0 g/dL   HCT 29.0 (L) 39.0 - 52.0 %   MCV 66.7 (L) 78.0 - 100.0 fL   MCH 19.5 (L) 26.0 - 34.0 pg   MCHC 29.3 (L) 30.0 - 36.0 g/dL   RDW 27.3 (H) 11.5 - 15.5 %   Platelets 88 (L) 150 - 400 K/uL    Comment: PLATELET COUNT CONFIRMED BY SMEAR  Reticulocytes     Status: Abnormal   Collection Time: 11/21/16 12:25 PM  Result Value Ref Range   Retic Ct Pct 0.4 0.4 - 3.1 %   RBC. 4.40 4.22 - 5.81 MIL/uL   Retic Count, Absolute 17.6 (L) 19.0 - 186.0 K/uL  Rapid urine drug screen (hospital performed)     Status: Abnormal   Collection Time: 11/21/16  3:20 PM  Result Value Ref  Range   Opiates NONE DETECTED NONE DETECTED    Cocaine POSITIVE (A) NONE DETECTED   Benzodiazepines NONE DETECTED NONE DETECTED   Amphetamines NONE DETECTED NONE DETECTED   Tetrahydrocannabinol POSITIVE (A) NONE DETECTED   Barbiturates NONE DETECTED NONE DETECTED    Comment:        DRUG SCREEN FOR MEDICAL PURPOSES ONLY.  IF CONFIRMATION IS NEEDED FOR ANY PURPOSE, NOTIFY LAB WITHIN 5 DAYS.        LOWEST DETECTABLE LIMITS FOR URINE DRUG SCREEN Drug Class       Cutoff (ng/mL) Amphetamine      1000 Barbiturate      200 Benzodiazepine   379 Tricyclics       024 Opiates          300 Cocaine          300 THC              50   Vitamin B12     Status: None   Collection Time: 11/21/16  3:40 PM  Result Value Ref Range   Vitamin B-12 391 180 - 914 pg/mL    Comment: (NOTE) This assay is not validated for testing neonatal or myeloproliferative syndrome specimens for Vitamin B12 levels.   Folate     Status: None   Collection Time: 11/21/16  3:40 PM  Result Value Ref Range   Folate 10.3 >5.9 ng/mL  Iron and TIBC     Status: Abnormal   Collection Time: 11/21/16  3:40 PM  Result Value Ref Range   Iron 30 (L) 45 - 182 ug/dL   TIBC 466 (H) 250 - 450 ug/dL   Saturation Ratios 6 (L) 17.9 - 39.5 %   UIBC 436 ug/dL  Ferritin     Status: Abnormal   Collection Time: 11/21/16  3:40 PM  Result Value Ref Range   Ferritin 4 (L) 24 - 336 ng/mL    Blood Alcohol level:  Lab Results  Component Value Date   ETH <5 11/21/2016   [facility]  07/29/2009    <5        LOWEST DETECTABLE LIMIT FOR SERUM ALCOHOL IS 5 mg/dL FOR MEDICAL PURPOSES ONLY    Metabolic Disorder Labs: No results found for: HGBA1C, MPG No results found for: PROLACTIN No results found for: CHOL, TRIG, HDL, CHOLHDL, VLDL, LDLCALC  Physical Findings: AIMS: Facial and Oral Movements Muscles of Facial Expression: None, normal Lips and Perioral Area: None, normal Jaw: None, normal Tongue: None, normal,Extremity Movements Upper (arms, wrists, hands, fingers): None,  normal Lower (legs, knees, ankles, toes): None, normal, Trunk Movements Neck, shoulders, hips: None, normal, Overall Severity Severity of abnormal movements (highest score from questions above): None, normal Incapacitation due to abnormal movements: None, normal Patient's awareness of abnormal movements (rate only patient's report): No Awareness, Dental Status Current problems with teeth and/or dentures?: No Does patient usually wear dentures?: No  CIWA:  CIWA-Ar Total: 0 COWS:     Musculoskeletal: Strength & Muscle Tone: within normal limits Gait & Station: normal Patient leans: N/A  Psychiatric Specialty Exam: Physical Exam  Vitals reviewed. Constitutional: He is oriented to person, place, and time. He appears well-developed.  Cardiovascular: Normal rate.   Neurological: He is alert and oriented to person, place, and time.  Psychiatric: He has a normal mood and affect. His behavior is normal.    Review of Systems  Psychiatric/Behavioral: Positive for substance abuse. The patient is nervous/anxious.     Blood pressure 99/76, pulse 63, temperature  98.2 F (36.8 C), temperature source Oral, resp. rate 16, height _0  (1.676 m), weight 52.2 kg (115 lb), SpO2 100 %.Body mass index is 18.56 kg/m.  General Appearance: Guarded  Eye Contact:  Minimal  Speech:  Clear and Coherent  Volume:  Decreased  Mood:  Depressed  Affect:  Depressed and Flat  Thought Process:  Coherent  Orientation:  Full (Time, Place, and Person)  Thought Content:  Hallucinations: None  Suicidal Thoughts:  No  Homicidal Thoughts:  No  Memory:  Immediate;   Fair Recent;   Fair Remote;   Fair  Judgement:  Fair  Insight:  Present  Psychomotor Activity:  Normal  Concentration:  Concentration: Fair  Recall:  [employer] of Knowledge:  Fair  Language:  Good  Akathisia:  No  Handed:  Right  AIMS (if indicated):     Assets:  Communication Skills Desire for Improvement Physical Health Resilience Social  Support  ADL's:  Intact  Cognition:  WNL  Sleep:  Number of Hours: 6.75     I agree with current treatment plan on 11/23/2016, Patient seen face-to-face for psychiatric evaluation follow-up, chart reviewed. Reviewed the information documented and agree with the treatment plan.  Treatment Plan Summary: Daily contact with patient to assess and evaluate symptoms and progress in treatment and Medication management   Continue with treatment plan 11/23/2016 except where noted  Continue with Zoloft 25 mg  for mood stabilization. With titration : (patient is refusing medications)   Start with Trazodone 50 mg for insomnia Initiated  ferrouse 325 mg PO BID  Will continue to monitor vitals ,medication compliance and treatment side effects while patient is here.   Reviewed labs Ferritin 4, Iron and TIBC is (decreased) :BAL - UDS - pos for cocaine, thc. CSW will start working on disposition.  Patient to participate in therapeutic milieu  Derrill Center, NP 11/23/2016, 12:09 PM

## 2016-11-23 NOTE — Progress Notes (Signed)
NUTRITION ASSESSMENT  John Moody identified as at risk on the Malnutrition Screen Tool  INTERVENTION: 1. Supplements: Ensure Enlive po BID, each supplement provides 350 kcal and 20 grams of protein   NUTRITION DIAGNOSIS: Unintentional weight loss related to sub-optimal intake as evidenced by John Moody report.   Goal: John Moody to meet >/= 90% of their estimated nutrition needs.  Monitor:  PO intake  Assessment:  John Moody admitted with depression and polysubstance abuse. John Moody states he has not been eating well for the past few week. John Moody has been ordered ensure, will continue order. John Moody with 20 lb of weight loss but unclear as to time frame.  Height: Ht Readings from Last 1 Encounters:  11/22/16 5\' 6"  (1.676 m)    Weight: Wt Readings from Last 1 Encounters:  11/22/16 115 lb (52.2 kg)    Weight Hx: Wt Readings from Last 10 Encounters:  11/22/16 115 lb (52.2 kg)  11/21/16 135 lb (61.2 kg)  11/20/16 135 lb (61.2 kg)  02/16/15 120 lb 8 oz (54.7 kg)    BMI:  Body mass index is 18.56 kg/m. John Moody meets criteria for normal based on current BMI.  Estimated Nutritional Needs: Kcal: 25-30 kcal/kg Protein: > 1 gram protein/kg Fluid: 1 ml/kcal  Diet Order: Diet regular Room service appropriate? Yes; Fluid consistency: Thin John Moody is also offered choice of unit snacks mid-morning and mid-afternoon.  John Moody is eating as desired.   Lab results and medications reviewed.   Tilda FrancoLindsey Dayron Odland, MS, RD, LDN Pager: [phone] After Hours Pager: [phone]

## 2016-11-23 NOTE — Progress Notes (Signed)
Patient ID: John Moody, male   DOB: 10/10/1975, 41 y.o.   MRN: [id number]   D: Pt has been bright on the unit today. Pt reported that he just wanted to go home, that his time was up. Pt signed a 72 hour request for discharge today, he reported that he was ready. Pt refused to take his dose of Zoloft this morning, he reported that he was not going to take that and that he did not need that. Pt reported that is depression was a 0, his hopelessness was a 0, and his anxiety was a 0. Pt reported that his goal for today was to talk to his son. Pt reported being negative SI/HI, no AH/VH noted. A: 15 min checks continued for patient safety. R: Pt safety maintained.
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
## 2016-11-23 NOTE — Progress Notes (Signed)
Patient ID: John GingerWalter R Ealey, male   DOB: 1975-04-25, 41 y.o.   MRN: [id number]    Pt signed a 72 hour request for discharged on 11/23/16 at 5:15pm. Jacquelyne BalintShalita Feras Gardella RN

## 2016-11-23 NOTE — BHH Group Notes (Signed)
B[facility] Group Notes:  (Nursing/MHT/Case Management/Adjunct)  Date:  11/23/2016  Time:  2:39 PM  Type of Therapy:  Psychoeducational Skills  Participation Level:  Did Not Attend  Participation Quality:  Did Not Attend  Affect:  Did Not Attend  Cognitive:  Did Not Attend  Insight:  None  Engagement in Group:  Did Not Attend  Modes of Intervention:  Did Not Attend  Summary of Progress/Problems: Pt did not attend patient self inventory group.   Jacquelyne BalintForrest, Zoltan Genest Shanta 11/23/2016, 2:39 PM

## 2016-11-24 DIAGNOSIS — F1910 Other psychoactive substance abuse, uncomplicated: Secondary | ICD-10-CM

## 2016-11-24 DIAGNOSIS — F1420 Cocaine dependence, uncomplicated: Secondary | ICD-10-CM

## 2016-11-24 DIAGNOSIS — D509 Iron deficiency anemia, unspecified: Secondary | ICD-10-CM

## 2016-11-24 DIAGNOSIS — F1020 Alcohol dependence, uncomplicated: Secondary | ICD-10-CM

## 2016-11-24 DIAGNOSIS — F1290 Cannabis use, unspecified, uncomplicated: Secondary | ICD-10-CM

## 2016-11-24 NOTE — Progress Notes (Signed)
Recreation Therapy Notes  INPATIENT RECREATION THERAPY ASSESSMENT  Patient Details Name: John Moody MRN: [id number] DOB: 1975/04/25 Today's Date: 11/24/2016  Patient Stressors: Family, Other (Comment) (Drugs)  Pt stated he was here because of himself and feeling down.  Coping Skills:   Isolate, Arguments, Substance Abuse, Avoidance, Talking, Music, Sports  Personal Challenges: Anger, Communication, Concentration, Decision-Making, Expressing Yourself, Problem-Solving, Relationships, Self-Esteem/Confidence, Social Interaction, Stress Management, Substance Abuse, Time Management, Trusting Others, Work Nutritional therapisterformance  Leisure Interests (2+):  Social - Family, Music - Other (Comment) (Make music)  Awareness of Community Resources:  No  Patient Strengths:  Love to work; Loveable  Patient Identified Areas of Improvement:  Attitude; Communicating  Current Recreation Participation:  1-2 times Moody week  Patient Goal for Hospitalization:  "Get sober and get attitude under control"  [place]  [place]  Current [place] (including self-harm):  No  Current HI:  No  Consent to Intern Participation: N/Moody   John Moody, John Moody  John Moody, John Moody 11/24/2016, 2:34 PM

## 2016-11-24 NOTE — Progress Notes (Signed)
Recreation Therapy Notes  Date: 11/24/16 Time: 1000 Location: [address] Dayroom  Group Topic: Coping Skills  Goal Area(s) Addresses:  Patients will be able to identify positive coping skills. Patients will be able to identify benefits of coping skills post d/c.  Intervention:  Dry erase marker, mind map worksheet, pencils  Activity: Mind map.  LRT gave each patient a blank copy of a mind map.  LRT and patients filled in the first eight boxes together.  Patients identified the areas were they would use coping skills as being in the hospital, stress, anxiety, bad news, drama/chaos, moving, loss of a loved one and health problems.  Individually, patients were to identify possible coping skills for each of the eight areas identified.  LRT would then reconvene the group and fill in the coping skills on the board.  Education: PharmacologistCoping Skills, Building control surveyorDischarge Planning.   Education Outcome: Acknowledges understanding/In group clarification offered/Needs additional education.   Clinical Observations/Feedback: Pt did not attend group   [place] Cookson Lillia AbedLindsay, LRT/CTRS         Caroll RancherLindsay, Abhishek Levesque A 11/24/2016 12:43 PM

## 2016-11-24 NOTE — [facility] Suicide Risk Assessment (Signed)
[facility] INPATIENT:  Family/Significant Other Suicide Prevention Education  Suicide Prevention Education:  Education Completed; John FarrierRegina Moody (pt's wife) [phone] has been identified by the patient as the family member/significant other with whom the patient will be residing, and identified as the person(s) who will aid the patient in the event of a mental health crisis (suicidal ideations/suicide attempt).  With written consent from the patient, the family member/significant other has been provided the following suicide prevention education, prior to the and/or following the discharge of the patient.  The suicide prevention education provided includes the following:  Suicide risk factors  Suicide prevention and interventions  National Suicide Hotline telephone number  [facility] assessment telephone number  [facility] Assistance 911  [facility] and/or [facility]idential Mobile Crisis [facility] telephone number  Request made of family/significant other to:  Remove weapons (e.g., guns, rifles, knives), all items previously/currently identified as safety concern.    Remove drugs/medications (over-the-counter, prescriptions, illicit drugs), all items previously/currently identified as a safety concern.  The family member/significant other verbalizes understanding of the suicide prevention education information provided.  The family member/significant other agrees to remove the items of safety concern listed above.  Pt's wife is concerned that pt will relapse and will not allow pt to return home if he does not go to treatment from the hospital. Aftercare plan and SPE reviewed.   Hala Narula N Smart LCSW 11/24/2016, 3:57 PM

## 2016-11-24 NOTE — Progress Notes (Signed)
[facility]  11/24/2016 2:26 PM John Moody  MRN:  [id number] Subjective: Patient states " I am better.'   Objective:Patient seen and chart reviewed.Discussed patient with treatment team. Pt today seen as guarded , reports he continues to be motivated to go to a substance abuse treatment program. Tolerating medications well, reports good effect from zoloft. Per RN pt signed a 72 hr discharge application .       Principal Problem: MDD (major depressive disorder), recurrent, severe, with psychosis (HCC) Diagnosis:   Patient Active Problem List   Diagnosis Date Noted  . Cocaine use disorder, severe, dependence (HCC) [F14.20] 11/24/2016  . Alcohol use disorder, severe, dependence (HCC) [F10.20] 11/24/2016  . Iron deficiency anemia [D50.9] 11/24/2016  . MDD (major depressive disorder), recurrent, severe, with psychosis (HCC) [F33.3] 11/22/2016   Total Time spent with patient: 20 minutes  Past Psychiatric History: Please see H&P.   Past Medical History:  Past Medical History:  Diagnosis Date  . Anemia   . Stevens-Johnson disease (HCC)    History reviewed. No pertinent surgical history. Family History: Please see H&P.  Family Psychiatric  History: Please see H&P.  Social History:  History  Alcohol Use  . 21.0 oz/week  . 35 Cans of beer per week     History  Drug Use  . Types: Marijuana, Cocaine    Social History   Social History  . Marital status: Legally Separated    Spouse name: N/A  . Number of children: N/A  . Years of education: N/A   Social History Main Topics  . Smoking status: Current Every Day Smoker    Packs/day: 0.50    Types: Cigarettes  . Smokeless tobacco: Never Used  . Alcohol use 21.0 oz/week    35 Cans of beer per week  . Drug use: Yes    Types: Marijuana, Cocaine  . Sexual activity: Not Asked   Other Topics Concern  . None   Social History Narrative  . None   Additional Social History:                          Sleep: Fair  Appetite:  Fair  Current Medications: Current Facility-Administered Medications  Medication Dose Route Frequency Provider Last Rate Last Dose  . acetaminophen (TYLENOL) tablet 650 mg  650 mg Oral Q6H PRN Rankin, Shuvon B, NP   650 mg at 11/22/16 1350  . alum & mag hydroxide-simeth (MAALOX/MYLANTA) 200-200-20 MG/5ML suspension 30 mL  30 mL Oral Q4H PRN Rankin, Shuvon B, NP      . feeding supplement (ENSURE ENLIVE) (ENSURE ENLIVE) liquid 237 mL  237 mL Oral BID BM Marliyah Reid, MD   237 mL at 11/24/16 1425  . ferrous sulfate tablet 325 mg  325 mg Oral BID WC Oneta RackLewis, Tanika N, NP   325 mg at 11/24/16 0824  . magnesium hydroxide (MILK OF MAGNESIA) suspension 30 mL  30 mL Oral Daily PRN Rankin, Shuvon B, NP      . methocarbamol (ROBAXIN) tablet 1,000 mg  1,000 mg Oral Q6H PRN Nira ConnBerry, Jason A, NP   1,000 mg at 11/23/16 2000  . naproxen (NAPROSYN) tablet 500 mg  500 mg Oral BID Nira ConnBerry, Jason A, NP   500 mg at 11/24/16 [id number]  . sertraline (ZOLOFT) tablet 25 mg  25 mg Oral Daily Oneta RackLewis, Tanika N, NP      . traZODone (DESYREL) tablet 50 mg  50 mg Oral QHS,MR X  1 Oneta Rack, NP        Lab Results:  No results found for this or any previous visit (from the past 48 hour(s)).  Blood Alcohol level:  Lab Results  Component Value Date   ETH <5 11/21/2016   [facility]  07/29/2009    <5        LOWEST DETECTABLE LIMIT FOR SERUM ALCOHOL IS 5 mg/dL FOR MEDICAL PURPOSES ONLY    Metabolic Disorder Labs: No results found for: HGBA1C, MPG No results found for: PROLACTIN No results found for: CHOL, TRIG, HDL, CHOLHDL, VLDL, LDLCALC  Physical Findings: AIMS: Facial and Oral Movements Muscles of Facial Expression: None, normal Lips and Perioral Area: None, normal Jaw: None, normal Tongue: None, normal,Extremity Movements Upper (arms, wrists, hands, fingers): None, normal Lower (legs, knees, ankles, toes): None, normal, Trunk Movements Neck, shoulders, hips: None, normal, Overall  Severity Severity of abnormal movements (highest score from questions above): None, normal Incapacitation due to abnormal movements: None, normal Patient's awareness of abnormal movements (rate only patient's report): No Awareness, Dental Status Current problems with teeth and/or dentures?: No Does patient usually wear dentures?: No  CIWA:  CIWA-Ar Total: 0 COWS:     Musculoskeletal: Strength & Muscle Tone: within normal limits Gait & Station: normal Patient leans: N/A  Psychiatric Specialty Exam: Physical Exam  Nursing note and vitals reviewed.   Review of Systems  Psychiatric/Behavioral: Positive for substance abuse.  All other systems reviewed and are negative.   Blood pressure 111/71, pulse 62, temperature 98.2 F (36.8 C), temperature source Oral, resp. rate 15, height  (1.676 m), weight 52.2 kg (115 lb), SpO2 100 %.Body mass index is 18.56 kg/m.  General Appearance: Guarded  Eye Contact:  Minimal  Speech:  Normal Rate  Volume:  Decreased  Mood:  Dysphoric  Affect:  Congruent  Thought Process:  Goal Directed and Descriptions of Associations: Circumstantial  Orientation:  Full (Time, Place, and Person)  Thought Content:  Rumination  Suicidal Thoughts:  No  Homicidal Thoughts:  No  Memory:  Immediate;   Fair Recent;   Fair Remote;   Fair  Judgement:  Fair  Insight:  Fair  Psychomotor Activity:  Normal  Concentration:  Concentration: Fair and Attention Span: Fair  Recall:  [employer] of Knowledge:  Fair  Language:  Fair  Akathisia:  No  Handed:  Right  AIMS (if indicated):     Assets:  Communication Skills  ADL's:  Intact  Cognition:  WNL  Sleep:  Number of Hours: 5.75     Treatment Plan Summary:Patient with depressive sx , SI , with intent to jump off a bridge , cocaine and alcohol abuse , several psychosocial stressors of relational issues with wife as well as job loss . Pt continues to want to go to a substance abuse treatment program. Continue  treatment.  Daily contact with patient to assess and evaluate symptoms and progress in treatment, Medication management and Plan see below   Zoloft 25 mg po daily for affective sx. Trazodone 50 mg po qhs prn for sleep. Ferrous sulphate 325 mg PO bid for iron deficiency anemia . Will order TSH. CSW will continue to work on disposition.   Ryleigh Buenger, MD 11/24/2016, 2:26 PM

## 2016-11-24 NOTE — Plan of Care (Signed)
Problem: Safety: Goal: Periods of time without injury will increase Outcome: Progressing Patient is safe and free from injury.  Denies suicidal thoughts and ideation.   

## 2016-11-24 NOTE — Tx Team (Signed)
Interdisciplinary Treatment and Diagnostic Plan Update  11/24/2016 Time of Session: [id number] John Moody MRN: [id number]  Principal Diagnosis: MDD (major depressive disorder), recurrent, severe, with psychosis (HCC)  Secondary Diagnoses: Principal Problem:   MDD (major depressive disorder), recurrent, severe, with psychosis (HCC)   Current Medications:  Current Facility-Administered Medications  Medication Dose Route Frequency Provider Last Rate Last Dose  . acetaminophen (TYLENOL) tablet 650 mg  650 mg Oral Q6H PRN Rankin, Shuvon B, NP   650 mg at 11/22/16 1350  . alum & mag hydroxide-simeth (MAALOX/MYLANTA) 200-200-20 MG/5ML suspension 30 mL  30 mL Oral Q4H PRN Rankin, Shuvon B, NP      . feeding supplement (ENSURE ENLIVE) (ENSURE ENLIVE) liquid 237 mL  237 mL Oral BID BM Eappen, Saramma, MD   237 mL at 11/23/16 1454  . ferrous sulfate tablet 325 mg  325 mg Oral BID WC Oneta Rack, NP   325 mg at 11/24/16 0824  . magnesium hydroxide (MILK OF MAGNESIA) suspension 30 mL  30 mL Oral Daily PRN Rankin, Shuvon B, NP      . methocarbamol (ROBAXIN) tablet 1,000 mg  1,000 mg Oral Q6H PRN Nira Conn A, NP   1,000 mg at 11/23/16 2000  . naproxen (NAPROSYN) tablet 500 mg  500 mg Oral BID Nira Conn A, NP   500 mg at 11/24/16 2130  . sertraline (ZOLOFT) tablet 25 mg  25 mg Oral Daily Oneta Rack, NP      . traZODone (DESYREL) tablet 50 mg  50 mg Oral QHS,MR X 1 Oneta Rack, NP       PTA Medications: Prescriptions Prior to Admission  Medication Sig Dispense Refill Last Dose  . cyclobenzaprine (FLEXERIL) 10 MG tablet Take 10 mg by mouth 2 (two) times daily.     . naproxen (NAPROSYN) 500 MG tablet Take 500 mg by mouth 2 (two) times daily.       Patient Stressors: Financial difficulties Health problems Marital or family conflict Substance abuse  Patient Strengths: Ability for insight Active sense of humor Average or above average intelligence Capable of independent  living Motivation for treatment/growth  Treatment Modalities: Medication Management, Group therapy, Case management,  1 to 1 session with clinician, Psychoeducation, Recreational therapy.   Physician Treatment Plan for Primary Diagnosis: MDD (major depressive disorder), recurrent, severe, with psychosis (HCC) Long Term Goal(s): Improvement in symptoms so as ready for discharge Improvement in symptoms so as ready for discharge   Short Term Goals: Ability to identify changes in lifestyle to reduce recurrence of condition will improve Ability to disclose and discuss suicidal ideas Compliance with prescribed medications will improve Ability to verbalize feelings will improve Ability to disclose and discuss suicidal ideas Compliance with prescribed medications will improve  Medication Management: Evaluate patient's response, side effects, and tolerance of medication regimen.  Therapeutic Interventions: 1 to 1 sessions, Unit Group sessions and Medication administration.  Evaluation of Outcomes: Progressing  Physician Treatment Plan for Secondary Diagnosis: Principal Problem:   MDD (major depressive disorder), recurrent, severe, with psychosis (HCC)  Long Term Goal(s): Improvement in symptoms so as ready for discharge Improvement in symptoms so as ready for discharge   Short Term Goals: Ability to identify changes in lifestyle to reduce recurrence of condition will improve Ability to disclose and discuss suicidal ideas Compliance with prescribed medications will improve Ability to verbalize feelings will improve Ability to disclose and discuss suicidal ideas Compliance with prescribed medications will improve     Medication Management:  Evaluate patient's response, side effects, and tolerance of medication regimen.  Therapeutic Interventions: 1 to 1 sessions, Unit Group sessions and Medication administration.  Evaluation of Outcomes: Progressing   RN Treatment Plan for Primary  Diagnosis: MDD (major depressive disorder), recurrent, severe, with psychosis (HCC) Long Term Goal(s): Knowledge of disease and therapeutic regimen to maintain health will improve  Short Term Goals: Ability to remain free from injury will improve, Ability to verbalize feelings will improve and Ability to disclose and discuss suicidal ideas  Medication Management: RN will administer medications as ordered by provider, will assess and evaluate patient's response and provide education to patient for prescribed medication. RN will report any adverse and/or side effects to prescribing provider.  Therapeutic Interventions: 1 on 1 counseling sessions, Psychoeducation, Medication administration, Evaluate responses to treatment, Monitor vital signs and CBGs as ordered, Perform/monitor CIWA, COWS, AIMS and Fall Risk screenings as ordered, Perform wound care treatments as ordered.  Evaluation of Outcomes: Progressing   LCSW Treatment Plan for Primary Diagnosis: MDD (major depressive disorder), recurrent, severe, with psychosis (HCC) Long Term Goal(s): Safe transition to appropriate next level of care at discharge, Engage patient in therapeutic group addressing interpersonal concerns.  Short Term Goals: Engage patient in aftercare planning with referrals and resources, Facilitate patient progression through stages of change regarding substance use diagnoses and concerns and Identify triggers associated with mental health/substance abuse issues  Therapeutic Interventions: Assess for all discharge needs, 1 to 1 time with Social worker, Explore available resources and support systems, Assess for adequacy in community support network, Educate family and significant other(s) on suicide prevention, Complete Psychosocial Assessment, Interpersonal group therapy.  Evaluation of Outcomes: Progressing   Progress in Treatment: Attending groups: Yes. Participating in groups: Yes. Taking medication as prescribed:  Yes. Toleration medication: Yes. Family/Significant other contact made: No, will contact:  pt's wife Patient understands diagnosis: Yes. Discussing patient identified problems/goals with staff: Yes. Medical problems stabilized or resolved: Yes. Denies suicidal/homicidal ideation: Yes. Issues/concerns per patient self-inventory: No. Other: n/a   New problem(s) identified: No, Describe:  n/a  New Short Term/Long Term Goal(s): detox, medication management for mood stabilization, development of comprehensive mental wellness/sobriety plan, elimination of SI thoughts and AH.   Patient Goal: "I'm ready to go home. I feel better."   Discharge Plan or Barriers: CSW assessing. Pt is requesting referral for inpatient treatment but also signed 72 hour request for discharge on 9/9.   Reason for Continuation of Hospitalization: Anxiety Depression Medication stabilization Withdrawal symptoms  Estimated Length of Stay:  Attendees: Patient: 11/24/2016 10:42 AM  Physician: Dr. Elna BreslowEappen MD 11/24/2016 10:42 AM  Nursing: Angelica RanJane, Doris, Elizabeth RN 11/24/2016 10:42 AM  RN Care Manager: Onnie BoerJennifer Clark CM 11/24/2016 10:42 AM  Social Worker: Trula SladeHeather Smart, LCSW 11/24/2016 10:42 AM  Recreational Therapist: x 11/24/2016 10:42 AM  Other: Reola Calkinsravis Money NP; Armandina Stammergnes Nwoko NP 11/24/2016 10:42 AM  Other:  11/24/2016 10:42 AM  Other: 11/24/2016 10:42 AM    Scribe for Treatment Team: Ledell PeoplesHeather N Smart, LCSW 11/24/2016 10:42 AM

## 2016-11-24 NOTE — Progress Notes (Signed)
DAR NOTE: Patient presents with calm affect and pleasant mood.  Refused Zoloft after several encouragement.  MD made aware.  Denies suicidal thoughts, auditory and visual hallucinations.  Described energy level as normal and concentration as good.  Rates depression at 1, hopelessness at 1, and anxiety at 1.  Maintained on routine safety checks.  Medications given as prescribed.  Support and encouragement offered as needed.  Attended group and participated.  States goal for today is "getting sober."  Patient observed socializing with peers in the dayroom.  Offered no complaint.

## 2016-11-24 NOTE — [facility] Group Notes (Signed)
LCSW Group Therapy Note   11/24/2016 1:15pm   Type of Therapy and Topic:  Group Therapy:  Overcoming Obstacles   Participation Level:  Active   Description of Group:    In this group patients will be encouraged to explore what they see as obstacles to their own wellness and recovery. They will be guided to discuss their thoughts, feelings, and behaviors related to these obstacles. The group will process together ways to cope with barriers, with attention given to specific choices patients can make. Each patient will be challenged to identify changes they are motivated to make in order to overcome their obstacles. This group will be process-oriented, with patients participating in exploration of their own experiences as well as giving and receiving support and challenge from other group members.   Therapeutic Goals: 1. Patient will identify personal and current obstacles as they relate to admission. 2. Patient will identify barriers that currently interfere with their wellness or overcoming obstacles.  3. Patient will identify feelings, thought process and behaviors related to these barriers. 4. Patient will identify two changes they are willing to make to overcome these obstacles:      Summary of Patient Progress   Stayed the entire time, engaged throughout.  Has a bit of a gamey feel-addict behavior.  "When you hang out with fast people, you find fast trouble."  States he needs to turn his back on negative friends, and embrace positive people-like family.  Later stated he wants to go to rehab, but then backtracked when CS[facility]   Therapeutic Modalities:   Cognitive Behavioral Therapy Solution Focused Therapy Motivational Interviewing Relapse Prevention Therapy  Ida RogueRodney B Floyce Bujak, LCSW 11/24/2016 4:27 PM

## 2016-11-25 LAB — TSH: TSH: 4.249 u[IU]/mL (ref 0.350–4.500)

## 2016-11-25 MED ORDER — NAPROXEN 500 MG PO TABS: 500.0000 mg | ORAL_TABLET | Freq: Two times a day (BID) | ORAL | Status: DC

## 2016-11-25 MED ORDER — FERROUS SULFATE 325 (65 FE) MG PO TABS: 325.0000 mg | ORAL_TABLET | Freq: Two times a day (BID) | ORAL | 0 refills | Status: DC

## 2016-11-25 MED ORDER — SERTRALINE HCL 25 MG PO TABS: 25.0000 mg | ORAL_TABLET | Freq: Every day | ORAL | 0 refills | Status: DC

## 2016-11-25 MED ORDER — TRAZODONE HCL 50 MG PO TABS: ORAL_TABLET | ORAL | 0 refills | Status: DC

## 2016-11-25 NOTE — Progress Notes (Signed)
Pt reports he is being discharged tomorrow. He denies SI/HI/AVH.  He has been in the dayroom watching TV, attended group, then stayed to watch the football game.  [facility]  He has been pleasant and cooperative, but a little silly at times.  He makes his needs known to staff.  He is still refusing meds except the pain meds and the iron supplement.  Support and encouragement offered.  Discharge plans are in process.   Safety maintained with q15 minute checks.

## 2016-11-25 NOTE — Discharge Summary (Signed)
Physician Discharge Summary Note  Patient:  John Moody is an 41 y.o., male MRN:  [id number] DOB:  Jun 30, 1975 Patient phone:  [phone] (home)  Patient [address]:   [address] [place] [address],   Total Time spent with patient: Greater than 30 minutes  Date of Admission:  11/22/2016 Date of Discharge: 11-25-16  Reason for Admission: Worsening symptoms of depression triggering auditory hallucinations & suicidal ideations with plan.  Principal Problem: MDD (major depressive disorder), recurrent, severe, with psychosis [facility](HCC)  Discharge Diagnoses: Patient Active Problem List   Diagnosis Date Noted  . Cocaine use disorder, severe, dependence (HCC) [F14.20] 11/24/2016  . Alcohol use disorder, severe, dependence (HCC) [F10.20] 11/24/2016  . Iron deficiency anemia [D50.9] 11/24/2016  . MDD (major depressive disorder), recurrent, severe, with psychosis (HCC) [F33.3] 11/22/2016   Past Psychiatric History: Cocaine use disorder, severe dependence, Alcohol use disorder, MDD, recurrent.  Past Medical History:  Past Medical History:  Diagnosis Date  . Anemia   . Stevens-Johnson disease (HCC)    History reviewed. No pertinent surgical history.  Family History: History reviewed. No pertinent family history.  Family Psychiatric  History: See H&P  Social History:  History  Alcohol Use  . 21.0 oz/week  . 35 Cans of beer per week     History  Drug Use  . Types: Marijuana, Cocaine    Social History   Social History  . Marital status: Legally Separated    Spouse name: N/A  . Number of children: N/A  . Years of education: N/A   Social History Main Topics  . Smoking status: Current Every Day Smoker    Packs/day: 0.50    Types: Cigarettes  . Smokeless tobacco: Never Used  . Alcohol use 21.0 oz/week    35 Cans of beer per week  . Drug use: Yes    Types: Marijuana, Cocaine  . Sexual activity: Not Asked   Other Topics Concern  . None   Social History  Narrative  . None   Hospital Course: John PhiWalter R Moody an 41 y.o.male. [facility] with depression and thoughts of suicide for the past week. Pt. Reports relationship complications with wife and lost of job has been stressful. Pt. SI with suicidal intent to jump off a bridge. Pt. Denies SI plan. Pt. Reports hearing voices telling him 'people will be better w/o him.' pt. Denies HI. Per RN pt. Stated he stood on the bridge looking.   After the above admission assessment, John Moody was started on the medication regimen for his presenting symptoms. He was medicated & discharged on; Sertraline 25 mg for depression & Trazodone 50 mg for insomnia. He was also enrolled & participated in the group counseling sessions being offered & held on this unit. He learned coping skills that should help him after discharge to cope better & maintain mood stability.  As his treatment progressed, daily assessment notes marked improvement in his symptoms. Last time John Moody heard the voice was around the time of his admission. He says he is feeling good today. He is optimistic about the future. No residual psychotic features. No craving for cocaine or THC. No anxiety. His features of depression has markedly improved since coming off cocaine and starting medications. No thoughts of violence. No access to weapons. No new stressors.    John Moody's case was presented during treatment team meeting this morning. The nursing staff reports that patient has been appropriate on the unit. Patient has been interacting well with peers. No  behavioral issues. Patient has not voiced any suicidal thoughts. Patient has not been observed to be internally stimulated or preoccupied. Patient has been adherent with his treatment recommendations. Patient has been tolerating their medication well.   During care review & discussion of his progress this morning at the treatment team meeting. Team members feel that patient is back to his baseline  level of function. Team agrees with plan to discharge patient today to continue mental health care & medication management at the [facility] here in [place], [place]. He is provided with all the necessary information needed to make this appointment without any problems. He left [facility] with all personal belongings in no apparent distress. Transportation per his arrangement.  Physical Findings: AIMS: Facial and Oral Movements Muscles of Facial Expression: None, normal Lips and Perioral Area: None, normal Jaw: None, normal Tongue: None, normal,Extremity Movements Upper (arms, wrists, hands, fingers): None, normal Lower (legs, knees, ankles, toes): None, normal, Trunk Movements Neck, shoulders, hips: None, normal, Overall Severity Severity of abnormal movements (highest score from questions above): None, normal Incapacitation due to abnormal movements: None, normal Patient's awareness of abnormal movements (rate only patient's report): No Awareness, Dental Status Current problems with teeth and/or dentures?: No Does patient usually wear dentures?: No  CIWA:  CIWA-Ar Total: 0 COWS:     Musculoskeletal: Strength & Muscle Tone: within normal limits Gait & Station: normal Patient leans: N/A  Psychiatric Specialty Exam: Physical Exam  Constitutional: He appears well-developed.  HENT:  Head: Normocephalic.  Eyes: Pupils are equal, round, and reactive to light.  Neck: Normal range of motion.  Cardiovascular: Normal rate.   Respiratory: Effort normal.  GI: Soft.  Genitourinary:  Genitourinary Comments: deferred  Musculoskeletal: Normal range of motion.  Neurological: He is alert.  Skin: Skin is warm.    Review of Systems  Constitutional: Negative.   HENT: Negative.   Eyes: Negative.   Respiratory: Negative.   Cardiovascular: Negative.   Gastrointestinal: Negative.   Genitourinary: Negative.   Musculoskeletal: Negative.   Skin: Negative.   Neurological: Negative.    Endo/Heme/Allergies: Negative.   Psychiatric/Behavioral: Positive for depression (Stable) and substance abuse (Hx. Cocaine/THC use disorder). Negative for suicidal ideas.    Blood pressure 94/62, pulse 60, temperature (!) 97.4 F (36.3 C), temperature source Oral, resp. rate 18, height  (1.676 m), weight 52.2 kg (115 lb), SpO2 100 %.Body mass index is 18.56 kg/m.  See H&P   Have you used any form of tobacco in the last 30 days? (Cigarettes, Smokeless Tobacco, Cigars, and/or Pipes): Yes  Has this patient used any form of tobacco in the last 30 days? (Cigarettes, Smokeless Tobacco, Cigars, and/or Pipes): No  Blood Alcohol level:  Lab Results  Component Value Date   ETH <5 11/21/2016   [facility]  07/29/2009    <5        LOWEST DETECTABLE LIMIT FOR SERUM ALCOHOL IS 5 mg/dL FOR MEDICAL PURPOSES ONLY   Metabolic Disorder Labs:  No results found for: HGBA1C, MPG No results found for: PROLACTIN No results found for: CHOL, TRIG, HDL, CHOLHDL, VLDL, LDLCALC  See Psychiatric Specialty Exam and Suicide Risk Assessment completed by Attending Physician prior to discharge.  Discharge destination:  Home  Is patient on multiple antipsychotic therapies at discharge:  No   Has Patient had three or more failed trials of antipsychotic monotherapy by history:  No  Recommended Plan for Multiple Antipsychotic Therapies: NA  Allergies as of 11/25/2016   No Known Allergies  Medication List    STOP taking these medications   cyclobenzaprine 10 MG tablet Commonly known as:  FLEXERIL     TAKE these medications     Indication  ferrous sulfate 325 (65 FE) MG tablet Take 1 tablet (325 mg total) by mouth 2 (two) times daily with a meal. For low iron  Indication:  Anemia From Inadequate Iron in the Body   naproxen 500 MG tablet Commonly known as:  NAPROSYN Take 1 tablet (500 mg total) by mouth 2 (two) times daily. Pain What changed:  additional instructions  Indication:  Pain   sertraline  25 MG tablet Commonly known as:  ZOLOFT Take 1 tablet (25 mg total) by mouth daily. For depression  Indication:  Major Depressive Disorder   traZODone 50 MG tablet Commonly known as:  DESYREL Take 1 tablet (50 mg) at bedtime  Indication:  Trouble Sleeping      Follow-up Information    Services[facility] Follow up.   Why:  You have been placed on waiting list. They will call when bed becomes available. Please call [facility] at discharge to check status of waitlist. Photo ID/proof of [employer] residency and [place] card required for screening. Thank you.  Contact information: [address] [place] [address] [phone]        [facility] Follow up.   Specialty:  Behavioral Health Why:  Walk in within 7 days of hospital/rehab discharge to be assessed for outpatient mental health services including: medication management and counseling. Walk in hours: Monday-Friday 8am-9am. Thank you. [address] [place] [address] [phone]          Follow-up recommendations: Activity:  As tolerated Diet: As recommended by your primary care doctor. Keep all scheduled follow-up appointments as recommended.   Comments: Patient is instructed prior to discharge to: Take all medications as prescribed by his/her mental healthcare provider. Report any adverse effects and or reactions from the medicines to his/her outpatient provider promptly. Patient has been instructed & cautioned: To not engage in alcohol and or illegal drug use while on prescription medicines. In the event of worsening symptoms, patient is instructed to call the crisis hotline, 911 and or go to the nearest ED for appropriate evaluation and treatment of symptoms. To follow-up with his/her primary care provider for your other medical issues, concerns and or health care needs.   Signed: Sanjuana Kava, NP, PMHNP, FNP-BC 11/25/2016, 10:36 AM

## 2016-11-25 NOTE — [facility] Suicide Risk Assessment (Signed)
[facility]   Principal Problem: MDD (major depressive disorder), recurrent, severe, with psychosis (HCC) Discharge Diagnoses:  Patient Active Problem List   Diagnosis Date Noted  . Cocaine use disorder, severe, dependence (HCC) [F14.20] 11/24/2016  . Alcohol use disorder, severe, dependence (HCC) [F10.20] 11/24/2016  . Iron deficiency anemia [D50.9] 11/24/2016  . MDD (major depressive disorder), recurrent, severe, with psychosis (HCC) [F33.3] 11/22/2016    Total Time spent with patient: 30 minutes  Musculoskeletal: Strength & Muscle Tone: within normal limits Gait & Station: normal Patient leans: N/A  Psychiatric Specialty Exam: Review of Systems  Psychiatric/Behavioral: Positive for substance abuse. Negative for depression and suicidal ideas.  All other systems reviewed and are negative.   Blood pressure 94/62, pulse 60, temperature (!) 97.4 F (36.3 C), temperature source Oral, resp. rate 18, height 5\' 6"  (1.676 m), weight 52.2 kg (115 lb), SpO2 100 %.Body mass index is 18.56 kg/m.  General Appearance: Casual  Eye Contact::  Fair  Speech:  Normal Rate409  Volume:  Normal  Mood:  Euthymic  Affect:  Appropriate  Thought Process:  Goal Directed and Descriptions of Associations: Intact  Orientation:  Full (Time, Place, and Person)  Thought Content:  Logical  Suicidal Thoughts:  No  Homicidal Thoughts:  No  Memory:  Immediate;   Fair Recent;   Fair Remote;   Fair  Judgement:  Fair  Insight:  Fair  Psychomotor Activity:  Normal  Concentration:  Fair  Recall:  FiservFair  Fund of Knowledge:Fair  Language: Fair  Akathisia:  No  Handed:  Right  AIMS (if indicated):     Assets:  Desire for Improvement  Sleep:  Number of Hours: 6  Cognition: WNL  ADL's:  Intact   Mental Status Per Nursing Assessment::   On Admission:  Suicidal ideation indicated by patient, Self-harm thoughts  Demographic Factors:  Male  Loss Factors: NA  Historical  Factors: Impulsivity  Risk Reduction Factors:   Positive social support  Continued Clinical Symptoms:  Alcohol/Substance Abuse/Dependencies  Cognitive Features That Contribute To Risk:  None    Suicide Risk:  Minimal: No identifiable suicidal ideation.  Patients presenting with no risk factors but with morbid ruminations; may be classified as minimal risk based on the severity of the depressive symptoms  Follow-up Information    [facility] Follow up.   Why:  You have been placed on waiting list. They will call when bed becomes available. Please call [facility] at discharge to check status of waitlist. [place] for [place]. Thank you.  Contact information: [address] [place] [address] [phone]        [facility] Follow up.   Specialty:  Behavioral Health Why:  Walk in within 7 days of hospital/rehab discharge to be assessed for outpatient mental health services including: medication management and counseling. Walk in hours: Monday-Friday 8am-9am. Thank you. Contact information: [address] [place] [address] [phone]           Plan Of Care/Follow-up recommendations:  Activity:  no restrictions Diet:  regular Tests:  as needed Other:  follow up with aftercare  Ankush Gintz, MD 11/25/2016, 9:37 AM

## 2016-11-25 NOTE — Progress Notes (Signed)
Recreation Therapy Notes  Date: 11/25/16 Time: 1000 Location: 500 Hall Dayroom  Group Topic: Leisure Education, Goal Setting  Goal Area(s) Addresses:  Patient will be able to identify at least 3 goals for life participation.  Patient will be able to identify benefit of investing in life goals.  Patient will be able to identify benefit of setting life goals.   Behavioral Response:  None  Intervention: Pencils, goals worksheet  Activity: Life Goals.  Patients were given a worksheet with six categories (family, friends, work/school, body, spirituality and mental health).  For each category, patients were to identify what they were doing well, what they needed to improve and set a goal for making the improvement.  Education:  Discharge Planning, PharmacologistCoping Skills, Leisure Education   Education Outcome: Acknowledges Education/In Group Clarification Provided/Needs Additional Education  Clinical Observations:  Pt came for the last 10 minutes of group.  Pt sat quietly and listened to his peers.   Caroll RancherMarjette Renay Crammer, LRT/CTRS         Caroll RancherLindsay, Ramiyah Mcclenahan A 11/25/2016 12:09 PM

## 2016-11-25 NOTE — Progress Notes (Signed)
Patient discharged to lobby. Patient was stable and appreciative at that time. All papers and prescriptions were given and valuables returned. Verbal understanding expressed. Denies SI/HI and A/VH. Patient given opportunity to express concerns and ask questions.  

## 2016-11-25 NOTE — Progress Notes (Signed)
  [facility] :  Will you be returning to the same living situation after discharge:  Yes,  [facility]  At discharge, do you have transportation home?: Yes,  family member Do you have the ability to pay for your medications: Yes,  per pt, he has medicaid  Release of information consent forms completed and submitted to medical records by CSW.  Patient to Follow up at: Follow-up Information    [facility] Follow up.   Why:  You have been placed on waiting list. They will call when bed becomes available. Please call [facility] at discharge to check status of waitlist. [place]/proof [place] for [place]. Thank you.  Contact information: [address] [place] [address] [phone]        [facility] Follow up.   Specialty:  Behavioral Health Why:  Walk in within 7 days of hospital/rehab discharge to be assessed for outpatient mental health services including: medication management and counseling. Walk in hours: Monday-Friday 8am-9am. Thank you. Contact information: [address] [place] [phone]           Next level of care provider has access to [facility] Link:no  Safety Planning and Suicide Prevention discussed: Yes,  SPE completed with pt's wife and with pt;   Have you used any form of tobacco in the last 30 days? (Cigarettes, Smokeless Tobacco, Cigars, and/or Pipes): Yes  Has patient been referred to the Quitline?: Patient refused referral  Patient has been referred for addiction treatment: Yes  Pulte HomesHeather N Smart, LCSW 11/25/2016, 2:45 PM

## 2017-11-15 ENCOUNTER — Encounter (HOSPITAL_COMMUNITY): Payer: Self-pay | Admitting: Emergency Medicine

## 2017-11-15 ENCOUNTER — Emergency Department (HOSPITAL_COMMUNITY)
Admission: EM | Admit: 2017-11-15 | Discharge: 2017-11-15 | Disposition: A | Discharge status: Home / Self Care | Payer: Self-pay | Attending: Emergency Medicine | Admitting: Emergency Medicine

## 2017-11-15 ENCOUNTER — Emergency Department (HOSPITAL_COMMUNITY): Payer: Self-pay

## 2017-11-15 ENCOUNTER — Other Ambulatory Visit: Payer: Self-pay

## 2017-11-15 DIAGNOSIS — Z79899 Other long term (current) drug therapy: Secondary | ICD-10-CM | POA: Insufficient documentation

## 2017-11-15 DIAGNOSIS — M545 Low back pain, unspecified: Secondary | ICD-10-CM

## 2017-11-15 DIAGNOSIS — F17210 Nicotine dependence, cigarettes, uncomplicated: Secondary | ICD-10-CM | POA: Insufficient documentation

## 2017-11-15 MED ORDER — NAPROXEN 500 MG PO TABS: 500.0000 mg | ORAL_TABLET | Freq: Two times a day (BID) | ORAL | 0 refills | Status: DC

## 2017-11-15 MED ORDER — METHOCARBAMOL 500 MG PO TABS: 500.0000 mg | ORAL_TABLET | Freq: Two times a day (BID) | ORAL | 0 refills | Status: DC

## 2017-11-15 NOTE — Discharge Instructions (Signed)
Please return to the Emergency Department for any new or worsening symptoms or if your symptoms do not improve. Please be sure to follow up with your Primary Care Physician as soon as possible regarding your visit today. If you do not have a Primary Doctor please use the resources below to establish one. You may use the naproxen as prescribed for pain.  Please drink plenty of water while taking this medication.  Please take this medication with food.  Please stop this medication if you notice signs of intestinal bleeding such as dark stools, abdominal pain, vomiting or nausea. You may use the muscle relaxer John Moody as prescribed.  Please do not drive while taking this medication.  Contact a health care provider if: You have pain that is not relieved with rest or medicine. You have increasing pain going down into your legs or buttocks. Your pain does not improve in 2 weeks. You have pain at night. You lose weight. You have a fever or chills. Get help right away if: You develop new bowel or bladder control problems. You have unusual weakness or numbness in your arms or legs. You develop nausea or vomiting. You develop abdominal pain. You feel faint.  RESOURCE GUIDE  Chronic Pain Problems: Contact John Moody  [phone] Patients need to be referred by their primary care doctor.  Insufficient Money for Medicine: Contact United Way:  call "211" or [facility] (385)861-3250.  No Primary Care Doctor: Call Health Connect  [phone] - can help you locate a primary care doctor that  accepts your insurance, provides certain services, etc. Physician Referral Service- [phone]  Agencies that provide inexpensive medical care: [facility]  [phone] [facility]  [phone] [facility]  [phone] [facility] Moody  [phone] Planned Parenthood  [phone] [facility] Child Moody  [phone]  Medicaid-accepting [facility]  Providers: Jovita Kussmaul Moody- [address]  [phone], Mon-Fri 9am-7pm, Sat 9am-1pm [facility]- [address] [place], Suite [place]  [phone] [facility]- [address], Suite [place]  [phone] [facility]- [address]  [phone] Renaye Rakers- [address] [place], [address], [phone]  Only accepts [place] patients after they have their name  applied to their card  Self Pay (no insurance) in [facility]: Sickle Cell Patients: Dr Willey Blade, [facility]  [address] [place], [phone] [facility] Urgent Care- [address] [place]  [phone]       [facility] [place]- [address]       -     [facility] Moody- see information above (Speak to [employer] if you do not have insurance)       -  [facility]- [address] [place], [phone]       -  [facility]- [address],  [phone]       -  [facility]- [address], [phone]       -  Dr Julio Sicks-  [address], [address], [place], [phone]       -  [facility]- [address], [phone]       -  [facility]- [address] [phone], also [address] [phone]       -  [facility]- [address] [place], [phone], 1st & 3rd Saturday   every month, 10am-1pm  1) Find a Doctor and Pay Out of Pocket Although you won't have to find out who is covered by your insurance plan, it is a good idea to ask around and get recommendations. You will then need to call the office and see if the doctor you have chosen will accept you as a new patient and what types of options they offer for patients who are self-pay. Some doctors offer discounts or will set up payment plans for their patients who do not have insurance, but you will need to ask so you aren't surprised when you get to your appointment.  2) Contact Your  Local Health Department Not all health departments have doctors that can see patients for sick visits, but many do, so it is worth a call to see if yours does. If you don't know where your local health department is, you can check in your phone book. The CDC also has a tool to help you locate your state's health department, and many state websites also have listings of all of their local health departments.  3) Find a Walk-in Moody If your illness is not likely to be very severe or complicated, you may want to try a walk in Moody. These are popping up all over the country in pharmacies, drugstores, and shopping centers. They're usually staffed by nurse practitioners or physician assistants that have been trained to treat common illnesses and complaints. They're usually fairly quick and inexpensive. However, if you have serious medical issues or chronic medical problems, these are probably not your best option  STD Testing [facility] Department of [facility] [place], STD [place], [address] [place], phone [phone] or [phone].  Monday - Friday, call for an appointment. [facility] Department of [employer], STD [place], [place] [address], [place], phone [phone] or [phone].  Monday - Friday, call for an appointment.  Abuse/Neglect: [facility] Child Abuse Hotline 762-270-4263 [facility] Child Abuse Hotline (620)487-7480 (After Hours)  Emergency Shelter:  [facility] 270-591-5727  Maternity Homes: Room at the [place] [facility] (510) 675-5409 [facility] (256) [phone]  MRSA Hotline #:   514 252 1696  [facility] Resources  Free [place] of [place]  [employer] [place] Dept. [address]                 [address]         [address]  Blondell Reveal Phone:  [phone]                                   Phone:  [phone]                   Phone:  [phone]  [facility], [phone] [facility] [phone]       -  [facility] in [place], [address],                                  [place] [phone] or [phone] (After Hours)   [place]  Substance Abuse Resources: Alcohol and Drug Services  [place] 619-351-1594 [facility] P[facility] (248)473-6953 Residential & Outpatient Substance Abuse Program  201-639-3169  Psychological Services: [place]  [phone] [place]  [place], [place] [address], [place], [place]: [phone] or [phone], [employer]  Dental Assistance  If unable to pay or uninsured, contact:  [facility] or [facility]. to become qualified for the adult dental [place].  Patients with Medicaid: [facility] [phone] [address], [place] [address] [phone]  If unable to pay, or uninsured, contact [facility] (941)725-7300) or [place] [phone])[phone] in [place], [place] in [facility]) to become qualified for the adult dental [place]   Other [place], [place], [place], [address], [place], [place], 2nd and 4th Thursday of the month at 6:30am.  10 clients each day by appointment, can sometimes see walk-in patients if someone does not show for an appointment. [facility]- [address] Hillard Danker [place], [place], [address], [place], [place], [place], [address], [place] Department- [phone] [facility] [phone]

## 2017-11-15 NOTE — ED Triage Notes (Addendum)
Pt states several weeks of back pain to lower back, started a new job in [place]. Taking OTC meds with no relief to pain. Ambulatory at triage. No loss of bladder or bowel function. No pain when lying flat, he does have pain with movement and lifting.

## 2017-11-15 NOTE — ED Notes (Signed)
Patient transported to X-ray 

## 2017-11-15 NOTE — ED Notes (Signed)
E-signature not available, Pt verbalized understanding of DC instructions and prescriptions.

## 2017-11-15 NOTE — ED Provider Notes (Signed)
[facility] EMERGENCY DEPARTMENT Provider Note   CSN: [id number] Arrival date & time: 11/15/17  1202     History   Chief Complaint Chief Complaint  Patient presents with  . Back Pain    HPI John Moody is a 42 y.o. male presenting for 3 weeks of lower back pain.  Patient states that he has been working a new job that requires him to pick up a lot of things from the ground and he has had gradually increasing pain for 3 weeks.  He describes the pain as a tight throbbing on the lower part of his back that is worse with movement and palpation.  Patient denies history of injury or fall.  Patient states that he has been using Aleve with minimal relief.  Patient denies numbness tingling or weakness to any of his extremities, saddle area paresthesias or loss of bladder or bowel control.  Patient denies fevers, IV drug use, history of kidney disease or gastric bleeding/ulcers.  HPI  Past Medical History:  Diagnosis Date  . Anemia   . 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 Cocaine use disorder, severe, dependence (HCC) 11/24/2016  . Alcohol use disorder, severe, dependence (HCC) 11/24/2016  . Iron deficiency anemia 11/24/2016  . MDD (major depressive disorder), recurrent, severe, with psychosis (HCC) 11/22/2016    No past surgical history on file.      Home Medications    Prior to Admission medications   Medication Sig Start Date End Date Taking? Authorizing Provider  ferrous sulfate 325 (65 FE) MG tablet Take 1 tablet (325 mg total) by mouth 2 (two) times daily with a meal. For low iron 11/25/16   Nwoko, Nicole Kindred I, NP  methocarbamol (ROBAXIN) 500 MG tablet Take 1 tablet (500 mg total) by mouth 2 (two) times daily. 11/15/17   Harlene Salts A, PA-C  naproxen (NAPROSYN) 500 MG tablet Take 1 tablet (500 mg total) by mouth 2 (two) times daily. 11/15/17   Harlene Salts A, PA-C  sertraline (ZOLOFT) 25 MG tablet Take  1 tablet (25 mg total) by mouth daily. For depression 11/26/16   Armandina Stammer I, NP  traZODone (DESYREL) 50 MG tablet Take 1 tablet (50 mg) at bedtime 11/25/16   Sanjuana Kava, NP    Family History No family history on file.  Social History Social History   Tobacco Use  . Smoking status: Current Every Day Smoker    Packs/day: 0.50    Types: Cigarettes  . Smokeless tobacco: Never Used  Substance Use Topics  . Alcohol use: Yes    Alcohol/week: 35.0 standard drinks    Types: 35 Cans of beer per week  . Drug use: Yes    Types: Marijuana, Cocaine     Allergies   Patient has no known allergies.   Review of Systems Review of Systems  Constitutional: Negative.  Negative for chills, fatigue and fever.  Gastrointestinal: Negative.  Negative for abdominal pain, diarrhea, nausea and vomiting.  Musculoskeletal: Positive for back pain. Negative for joint swelling and neck pain.  Skin: Negative.  Negative for color change and wound.  Neurological: Negative.  Negative for dizziness, weakness, numbness and headaches.       Denies bowel or bladder incontinence or saddle area paresthesias.     Physical Exam Updated Vital Signs BP 97/77 (BP Location: Right Arm)   Pulse 71   Temp 97.7 F (36.5 C) (Oral)  Resp 18   SpO2 99%   Physical Exam  Constitutional: He is oriented to person, place, and time. He appears well-developed and well-nourished. No distress.  HENT:  Head: Normocephalic and atraumatic.  Right Ear: External ear normal.  Left Ear: External ear normal.  Nose: Nose normal.  Eyes: Pupils are equal, round, and reactive to light. EOM are normal.  Neck: Trachea normal and normal range of motion. No tracheal deviation present.  Cardiovascular:  Pulses:      Dorsalis pedis pulses are 2+ on the right side, and 2+ on the left side.       Posterior tibial pulses are 2+ on the right side, and 2+ on the left side.  Pulmonary/Chest: Effort normal. No respiratory distress.    Abdominal: Soft. There is no tenderness. There is no rebound and no guarding.  Musculoskeletal: Normal range of motion.       Cervical back: Normal.       Thoracic back: Normal.       Lumbar back: He exhibits tenderness. He exhibits no bony tenderness, no swelling, no edema and no deformity.       Back:  No midline thoracic or cervical spinal tenderness to palpation, no paraspinal muscle tenderness, no deformity, crepitus, or step-off noted.  Bilateral lumbar muscular tenderness to palpation. No lumbar midline spinal tenderness to palpation, no deformity crepitus or step-off.   Feet:  Right Foot:  Protective Sensation: 3 sites tested. 3 sites sensed.  Left Foot:  Protective Sensation: 3 sites tested. 3 sites sensed.  Neurological: He is alert and oriented to person, place, and time. He has normal strength. No cranial nerve deficit or sensory deficit.  Mental Status: Alert, oriented, thought content appropriate, able to give a coherent history. Speech fluent without evidence of aphasia. Able to follow 2 step commands without difficulty. Cranial Nerves: II-XII: Grossly intact bilaterally. Motor: Normal tone. 5/5 strength in upper and lower extremities bilaterally including strong and equal grip strength and dorsiflexion/plantar flexion Sensory: Sensation intact to light touch in all extremities. Cerebellar: normal finger-to-nose with bilateral upper extremities. Normal heel-to -shin balance bilaterally of the lower extremity. No pronator drift.  Gait: normal gait and balance CV: distal pulses palpable throughout  Skin: Skin is warm and dry. Capillary refill takes less than 2 seconds.  Psychiatric: He has a normal mood and affect. His behavior is normal.     ED Treatments / Results  Labs (all labs ordered are listed, but only abnormal results are displayed) Labs Reviewed - No data to display  EKG None  Radiology Dg Lumbar Spine Complete  Result Date: 11/15/2017 CLINICAL  DATA:  Persistent back pain EXAM: LUMBAR SPINE - COMPLETE 4+ VIEW COMPARISON:  None. FINDINGS: There is no evidence of lumbar spine fracture. Alignment is normal. Intervertebral disc spaces are maintained. Artifact overlies the abdomen and back externally. Preserved vertebral body heights and disc spaces. No pars defects. Normal SI joints. Normal bowel gas pattern. IMPRESSION: Negative. Electronically Signed   By: Judie Petit.  Shick M.D.   On: 11/15/2017 14:12    Procedures Procedures (including critical care time)  Medications Ordered in ED Medications - No data to display   Initial Impression / Assessment and Plan / ED Course  I have reviewed the triage vital signs and the nursing notes.  Pertinent labs & imaging results that were available during my care of the patient were reviewed by me and considered in my medical decision making (see chart for details).     Lumbar  imaging negative.  Patient presenting with bilateral lower back pain.   No neurological deficits and normal neuro exam.  Patient can walk but states is painful.  Patient denies loss of bowel/bladder control or saddle area paresthesias.  No concern for cauda equina.  No fever, night sweats, weight loss, h/o cancer, or IVDU. RICE protocol and pain medicine indicated and discussed with patient.   Naproxen 500mg  BID prescribed. Patient denies history of CKD or gastric ulcers/bleeding. Robaxin 500mg  BID prescribed. Patient informed to avoid driving or operating heavy machinery while taking muscle relaxer.  At this time there does not appear to be any evidence of an acute emergency medical condition and the patient appears stable for discharge with appropriate outpatient follow up. Diagnosis was discussed with patient who verbalizes understanding of care plan and is agreeable to discharge. I have discussed return precautions with patient who verbalizes understanding of return precautions. Patient strongly encouraged to follow-up with their  PCP. All questions answered.   Note: Portions of this report may have been transcribed using voice recognition software. Every effort was made to ensure accuracy; however, inadvertent computerized transcription errors may still be present.   Final Clinical Impressions(s) / ED Diagnoses   Final diagnoses:  Acute bilateral low back pain without sciatica    ED Discharge Orders         Ordered    naproxen (NAPROSYN) 500 MG tablet  2 times daily     11/15/17 1507    methocarbamol (ROBAXIN) 500 MG tablet  2 times daily     11/15/17 1507           Bill Salinas, PA-C 11/15/17 1527    Melene Plan, DO 11/15/17 1530

## 2018-03-03 ENCOUNTER — Emergency Department (HOSPITAL_COMMUNITY)
Admission: EM | Admit: 2018-03-03 | Discharge: 2018-03-03 | Disposition: A | Discharge status: Home / Self Care | Payer: Medicaid Other | Attending: Emergency Medicine | Admitting: Emergency Medicine

## 2018-03-03 ENCOUNTER — Encounter (HOSPITAL_COMMUNITY): Payer: Self-pay | Admitting: Emergency Medicine

## 2018-03-03 ENCOUNTER — Other Ambulatory Visit: Payer: Self-pay

## 2018-03-03 DIAGNOSIS — K0889 Other specified disorders of teeth and supporting structures: Secondary | ICD-10-CM | POA: Insufficient documentation

## 2018-03-03 DIAGNOSIS — K069 Disorder of gingiva and edentulous alveolar ridge, unspecified: Secondary | ICD-10-CM | POA: Insufficient documentation

## 2018-03-03 DIAGNOSIS — F17210 Nicotine dependence, cigarettes, uncomplicated: Secondary | ICD-10-CM | POA: Insufficient documentation

## 2018-03-03 DIAGNOSIS — Z79899 Other long term (current) drug therapy: Secondary | ICD-10-CM | POA: Insufficient documentation

## 2018-03-03 MED ORDER — PENICILLIN V POTASSIUM 250 MG PO TABS
500.0000 mg | ORAL_TABLET | Freq: Once | ORAL | Status: AC
  Administered 2018-03-03: 500 mg via ORAL
  Filled 2018-03-03: qty 2

## 2018-03-03 MED ORDER — HYDROCODONE-ACETAMINOPHEN 5-325 MG PO TABS
1.0000 | ORAL_TABLET | Freq: Once | ORAL | Status: AC
  Administered 2018-03-03: 1 via ORAL
  Filled 2018-03-03: qty 1

## 2018-03-03 MED ORDER — HYDROCODONE-ACETAMINOPHEN 5-325 MG PO TABS: 1.0000 | ORAL_TABLET | ORAL | 0 refills | Status: DC | PRN

## 2018-03-03 MED ORDER — PENICILLIN V POTASSIUM 500 MG PO TABS: 500.0000 mg | ORAL_TABLET | Freq: Four times a day (QID) | ORAL | 0 refills | Status: AC

## 2018-03-03 NOTE — ED Notes (Signed)
Pt reports taking the bus, he is not driving himself

## 2018-03-03 NOTE — Discharge Instructions (Signed)
For immediate relief of your discomfort, rinse your mouth out with warm salt water 3 or 4 times a day.  You can try using ice on the sore area 3 or 4 times a day.  We are prescribing a narcotic pain reliever, and antibiotic, to help you recover.  You can also use ibuprofen, 400 mg 3 times a day with meals for pain.  It is important to follow-up with a dentist for tooth removal and ongoing dental care as soon as possible.  Please use the resource guide attached, to help you get additional medical treatment dental treatment.

## 2018-03-03 NOTE — ED Triage Notes (Signed)
Pt reports l side dental pain x1 week.  Unrelieved by tramadol, oral gel, ibuprofen. Reports 10/10 pain. States it feels like his back tooth "leaning forward"

## 2018-03-03 NOTE — ED Provider Notes (Signed)
[facility] EMERGENCY DEPARTMENT Provider Note   CSN: [id number] Arrival date & time: 03/03/18  1055     History   Chief Complaint Chief Complaint  Patient presents with  . Dental Pain    HPI John Moody is a 42 y.o. male.  HPI   He presents for evaluation of dental pain.  He has a tooth which is misplaced in his mouth.  He has no recent dental care.  He denies fever, shortness of breath, cough or dizziness.  There are no other known modifying factors.  Past Medical History:  Diagnosis Date  . Anemia   . 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 Cocaine use disorder, severe, dependence (HCC) 11/24/2016  . Alcohol use disorder, severe, dependence (HCC) 11/24/2016  . Iron deficiency anemia 11/24/2016  . 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 No pertinent surgical history.      Home Medications    Prior to Admission medications   Medication Sig Start Date End Date Taking? Authorizing Provider  ferrous sulfate 325 (65 FE) MG tablet Take 1 tablet (325 mg total) by mouth 2 (two) times daily with a meal. For low iron 11/25/16   Armandina Stammer I, NP  HYDROcodone-acetaminophen (NORCO) 5-325 MG tablet Take 1 tablet by mouth every 4 (four) hours as needed. 03/03/18   Mancel Bale, MD  methocarbamol (ROBAXIN) 500 MG tablet Take 1 tablet (500 mg total) by mouth 2 (two) times daily. 11/15/17   Harlene Salts A, PA-C  naproxen (NAPROSYN) 500 MG tablet Take 1 tablet (500 mg total) by mouth 2 (two) times daily. 11/15/17   Harlene Salts A, PA-C  penicillin v potassium (VEETID) 500 MG tablet Take 1 tablet (500 mg total) by mouth 4 (four) times daily for 7 days. 03/03/18 03/10/18  Mancel Bale, MD  sertraline (ZOLOFT) 25 MG tablet Take 1 tablet (25 mg total) by mouth daily. For depression 11/26/16   Armandina Stammer I, NP  traZODone (DESYREL) 50 MG tablet  Take 1 tablet (50 mg) at bedtime 11/25/16   Sanjuana Kava, NP    Family History No family history on file.  Social History Social History   Tobacco Use  . Smoking status: Current Every Day Smoker    Packs/day: 0.50    Types: Cigarettes  . Smokeless tobacco: Never Used  Substance Use Topics  . Alcohol use: Yes    Alcohol/week: 35.0 standard drinks    Types: 35 Cans of beer per week  . Drug use: Yes    Types: Marijuana, Cocaine     Allergies   Patient has no known allergies.   Review of Systems Review of Systems  All other systems reviewed and are negative.    Physical Exam Updated Vital Signs BP 108/73   Pulse 85   Temp 98.4 F (36.9 C) (Oral)   Ht 5\' 6"  (1.676 m)   Wt 61.2 kg   SpO2 99%   BMI 21.79 kg/m   Physical Exam Vitals signs and nursing note reviewed.  Constitutional:      Appearance: He is well-developed.  HENT:     Head: Normocephalic and atraumatic.     Right Ear: External ear normal.     Left Ear: External ear normal.     Nose: Nose normal.     Mouth/Throat:     Comments: No trismus.  Poor dentition.  Left  lower molar, malposition, anteriorly displaced, very loose but not able to be removed with gentle traction.  There is moderate associated tenderness.  Associated gum inflammation, with erosion.  Upper molar is also loose but does not come out with gentle traction. Eyes:     Conjunctiva/sclera: Conjunctivae normal.     Pupils: Pupils are equal, round, and reactive to light.  Neck:     Musculoskeletal: Normal range of motion and neck supple.     Trachea: Phonation normal.  Cardiovascular:     Rate and Rhythm: Normal rate.  Pulmonary:     Effort: Pulmonary effort is normal.  Abdominal:     Palpations: Abdomen is soft.  Musculoskeletal: Normal range of motion.  Skin:    General: Skin is warm and dry.  Neurological:     Mental Status: He is alert and oriented to person, place, and time.     Cranial Nerves: No cranial nerve deficit.      Sensory: No sensory deficit.     Motor: No abnormal muscle tone.     Coordination: Coordination normal.  Psychiatric:        Mood and Affect: Mood normal.        Behavior: Behavior normal.        Thought Content: Thought content normal.        Judgment: Judgment normal.      ED Treatments / Results  Labs (all labs ordered are listed, but only abnormal results are displayed) Labs Reviewed - No data to display  EKG None  Radiology No results found.  Procedures Procedures (including critical care time)  Medications Ordered in ED Medications  HYDROcodone-acetaminophen (NORCO/VICODIN) 5-325 MG per tablet 1 tablet (has no administration in time range)  penicillin v potassium (VEETID) tablet 500 mg (has no administration in time range)     Initial Impression / Assessment and Plan / ED Course  I have reviewed the triage vital signs and the nursing notes.  Pertinent labs & imaging results that were available during my care of the patient were reviewed by me and considered in my medical decision making (see chart for details).      Patient Vitals for the past 24 hrs:  BP Temp Temp src Pulse SpO2 Height Weight  03/03/18 1121 - - - - - 5\' 6"  (1.676 m) 61.2 kg  03/03/18 1120 108/73 98.4 F (36.9 C) Oral 85 99 % - -    11:43 AM Reevaluation with update and discussion. After initial assessment and treatment, an updated evaluation reveals no change in clinical status.  Findings discussed with the patient and all questions were answered. Mancel Bale   Medical Decision Making: Poor dentition with likely gum disease dental decay with tooth avulsion left lower.  Moderate pain associated with this, but no suspicion for deep tissue infection.  There is no indication for hospitalization or further imaging, at this time.  CRITICAL CARE-no Performed by: Mancel Bale  Nursing Notes Reviewed/ Care Coordinated Applicable Imaging Reviewed Interpretation of Laboratory Data incorporated  into ED treatment  The patient appears reasonably screened and/or stabilized for discharge and I doubt any other medical condition or other [facility] requiring further screening, evaluation, or treatment in the ED at this time prior to discharge.  Plan: Home Medications-OTC analgesia; Home Treatments-symptomatic treatment for gum disease, with rinses and cool compresses.; return here if the recommended treatment, does not improve the symptoms; Recommended follow up-dental follow-up ASAP, as able.   Final Clinical Impressions(s) / ED Diagnoses  Final diagnoses:  Pain, dental  Chronic gum disease    ED Discharge Orders         Ordered    penicillin v potassium (VEETID) 500 MG tablet  4 times daily     03/03/18 1149    HYDROcodone-acetaminophen (NORCO) 5-325 MG tablet  Every 4 hours PRN     03/03/18 1149           Mancel BaleWentz, Zanaria Morell, MD 03/03/18 1150

## 2018-05-20 ENCOUNTER — Encounter (HOSPITAL_COMMUNITY): Payer: Self-pay | Admitting: Emergency Medicine

## 2018-05-20 ENCOUNTER — Other Ambulatory Visit: Payer: Self-pay

## 2018-05-20 ENCOUNTER — Emergency Department (HOSPITAL_COMMUNITY)
Admission: EM | Admit: 2018-05-20 | Discharge: 2018-05-20 | Disposition: A | Discharge status: Home / Self Care | Payer: Medicaid Other | Attending: Emergency Medicine | Admitting: Emergency Medicine

## 2018-05-20 DIAGNOSIS — F17210 Nicotine dependence, cigarettes, uncomplicated: Secondary | ICD-10-CM | POA: Insufficient documentation

## 2018-05-20 DIAGNOSIS — R112 Nausea with vomiting, unspecified: Secondary | ICD-10-CM | POA: Insufficient documentation

## 2018-05-20 DIAGNOSIS — Z79899 Other long term (current) drug therapy: Secondary | ICD-10-CM | POA: Insufficient documentation

## 2018-05-20 DIAGNOSIS — R197 Diarrhea, unspecified: Secondary | ICD-10-CM | POA: Insufficient documentation

## 2018-05-20 LAB — CBC WITH DIFFERENTIAL/PLATELET
ABS IMMATURE GRANULOCYTES: 0.02 10*3/uL (ref 0.00–0.07)
BASOS ABS: 0.1 10*3/uL (ref 0.0–0.1)
Basophils Relative: 1 %
EOS ABS: 0.5 10*3/uL (ref 0.0–0.5)
Eosinophils Relative: 7 %
HEMATOCRIT: 40.1 % (ref 39.0–52.0)
Hemoglobin: 12.4 g/dL — ABNORMAL LOW (ref 13.0–17.0)
IMMATURE GRANULOCYTES: 0 %
LYMPHS ABS: 1.8 10*3/uL (ref 0.7–4.0)
Lymphocytes Relative: 23 %
MCH: 23 pg — ABNORMAL LOW (ref 26.0–34.0)
MCHC: 30.9 g/dL (ref 30.0–36.0)
MCV: 74.3 fL — ABNORMAL LOW (ref 80.0–100.0)
Monocytes Absolute: 0.9 10*3/uL (ref 0.1–1.0)
Monocytes Relative: 11 %
NRBC: 0 % (ref 0.0–0.2)
Neutro Abs: 4.7 10*3/uL (ref 1.7–7.7)
Neutrophils Relative %: 58 %
PLATELETS: 401 10*3/uL — AB (ref 150–400)
RBC: 5.4 MIL/uL (ref 4.22–5.81)
RDW: 19.8 % — AB (ref 11.5–15.5)
WBC: 8 10*3/uL (ref 4.0–10.5)

## 2018-05-20 LAB — COMPREHENSIVE METABOLIC PANEL
ALBUMIN: 3.4 g/dL — AB (ref 3.5–5.0)
ALT: 13 U/L (ref 0–44)
ANION GAP: 7 (ref 5–15)
AST: 20 U/L (ref 15–41)
Alkaline Phosphatase: 59 U/L (ref 38–126)
BUN: 9 mg/dL (ref 6–20)
CHLORIDE: 105 mmol/L (ref 98–111)
CO2: 26 mmol/L (ref 22–32)
Calcium: 8.7 mg/dL — ABNORMAL LOW (ref 8.9–10.3)
Creatinine, Ser: 1.04 mg/dL (ref 0.61–1.24)
GFR calc Af Amer: >60 mL/min (ref >60)
GFR calc non Af Amer: >60 mL/min (ref >60)
GLUCOSE: 105 mg/dL — AB (ref 70–99)
Potassium: 3.8 mmol/L (ref 3.5–5.1)
Sodium: 138 mmol/L (ref 135–145)
TOTAL PROTEIN: 7.4 g/dL (ref 6.5–8.1)
Total Bilirubin: 0.3 mg/dL (ref 0.3–1.2)

## 2018-05-20 LAB — LIPASE, BLOOD: Lipase: 27 U/L (ref 11–51)

## 2018-05-20 MED ORDER — ONDANSETRON HCL 4 MG PO TABS: 4.0000 mg | ORAL_TABLET | Freq: Three times a day (TID) | ORAL | 0 refills | Status: DC | PRN

## 2018-05-20 MED ORDER — SODIUM CHLORIDE 0.9 % IV BOLUS
1000.0000 mL | Freq: Once | INTRAVENOUS | Status: AC
  Administered 2018-05-20: 1000 mL via INTRAVENOUS

## 2018-05-20 MED ORDER — ONDANSETRON HCL 4 MG/2ML IJ SOLN
4.0000 mg | Freq: Once | INTRAMUSCULAR | Status: AC
  Administered 2018-05-20: 4 mg via INTRAVENOUS
  Filled 2018-05-20: qty 2

## 2018-05-20 NOTE — ED Provider Notes (Signed)
[facility] EMERGENCY DEPARTMENT Provider Note   CSN: [id number] Arrival date & time: 05/20/18  4315    History   Chief Complaint Chief Complaint  Patient presents with  . Emesis  . Diarrhea    HPI John Moody is a 43 y.o. male.  He is presenting to the emergency department complaining of nausea vomiting diarrhea for 2 days.  He said he is probably had 3 or 4 episodes of each.  No blood from either end.  No recent travel no unusual foods.  No real abdominal pain just some cramps.  No sick contacts.  He said he is had some sweats and feels feverish but has not taken his temperature.  No chest pain or shortness of breath.     The history is provided by the patient.  Emesis  Severity:  Moderate Duration:  2 days Timing:  Sporadic Number of daily episodes:  3 Quality:  Stomach contents Progression:  Unchanged Chronicity:  New Recent urination:  Normal Context: not post-tussive and not self-induced   Relieved by:  None tried Worsened by:  Nothing Ineffective treatments:  None tried Associated symptoms: chills, diarrhea and fever   Associated symptoms: no abdominal pain, no arthralgias, no cough, no headaches, no myalgias, no sore throat and no URI   Risk factors: no suspect food intake     Past Medical History:  Diagnosis Date  . Anemia   . 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 Cocaine use disorder, severe, dependence (HCC) 11/24/2016  . Alcohol use disorder, severe, dependence (HCC) 11/24/2016  . Iron deficiency anemia 11/24/2016  . 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 No pertinent surgical history.      Home Medications    Prior to Admission medications   Medication Sig Start Date End Date Taking? Authorizing Provider  ferrous sulfate 325 (65 FE) MG tablet Take 1 tablet (325 mg total) by mouth 2 (two) times daily with a  meal. For low iron 11/25/16   Armandina Stammer I, NP  HYDROcodone-acetaminophen (NORCO) 5-325 MG tablet Take 1 tablet by mouth every 4 (four) hours as needed. 03/03/18   Mancel Bale, MD  methocarbamol (ROBAXIN) 500 MG tablet Take 1 tablet (500 mg total) by mouth 2 (two) times daily. 11/15/17   Harlene Salts A, PA-C  naproxen (NAPROSYN) 500 MG tablet Take 1 tablet (500 mg total) by mouth 2 (two) times daily. 11/15/17   Harlene Salts A, PA-C  sertraline (ZOLOFT) 25 MG tablet Take 1 tablet (25 mg total) by mouth daily. For depression 11/26/16   Armandina Stammer I, NP  traZODone (DESYREL) 50 MG tablet Take 1 tablet (50 mg) at bedtime 11/25/16   Sanjuana Kava, NP    Family History History reviewed. No pertinent family history.  Social History Social History   Tobacco Use  . Smoking status: Current Every Day Smoker    Packs/day: 0.50    Types: Cigarettes  . Smokeless tobacco: Never Used  Substance Use Topics  . Alcohol use: Yes    Alcohol/week: 35.0 standard drinks    Types: 35 Cans of beer per week  . Drug use: Yes    Types: Marijuana, Cocaine     Allergies   Patient has no known allergies.   Review of Systems Review of Systems  Constitutional: Positive for chills and fever.  HENT: Negative for sore throat.  Eyes: Negative for visual disturbance.  Respiratory: Negative for cough and shortness of breath.   Cardiovascular: Negative for chest pain.  Gastrointestinal: Positive for diarrhea and vomiting. Negative for abdominal pain.  Genitourinary: Negative for dysuria.  Musculoskeletal: Negative for arthralgias and myalgias.  Skin: Negative for rash.  Neurological: Negative for headaches.     Physical Exam Updated Vital Signs BP 111/81 (BP Location: Right Arm)   Pulse 89   Temp 98.2 F (36.8 C) (Oral)   Resp 15   Ht 5\' 8"  (1.727 m)   Wt 63.5 kg   SpO2 100%   BMI 21.29 kg/m   Physical Exam Vitals signs and nursing note reviewed.  Constitutional:      Appearance: He is  well-developed.  HENT:     Head: Normocephalic and atraumatic.  Eyes:     Conjunctiva/sclera: Conjunctivae normal.  Neck:     Musculoskeletal: Neck supple.  Cardiovascular:     Rate and Rhythm: Normal rate and regular rhythm.     Heart sounds: No murmur.  Pulmonary:     Effort: Pulmonary effort is normal. No respiratory distress.     Breath sounds: Normal breath sounds.  Abdominal:     Palpations: Abdomen is soft.     Tenderness: There is no abdominal tenderness. There is no guarding or rebound.  Musculoskeletal: Normal range of motion.        General: No signs of injury.     Right lower leg: No edema.     Left lower leg: No edema.  Skin:    General: Skin is warm and dry.     Capillary Refill: Capillary refill takes less than 2 seconds.  Neurological:     General: No focal deficit present.     Mental Status: He is alert.     Gait: Gait normal.      ED Treatments / Results  Labs (all labs ordered are listed, but only abnormal results are displayed) Labs Reviewed  COMPREHENSIVE METABOLIC PANEL - Abnormal; Notable for the following components:      Result Value   Glucose, Bld 105 (*)    Calcium 8.7 (*)    Albumin 3.4 (*)    All other components within normal limits  CBC WITH DIFFERENTIAL/PLATELET - Abnormal; Notable for the following components:   Hemoglobin 12.4 (*)    MCV 74.3 (*)    MCH 23.0 (*)    RDW 19.8 (*)    Platelets 401 (*)    All other components within normal limits  LIPASE, BLOOD    EKG None  Radiology No results found.  Procedures Procedures (including critical care time)  Medications Ordered in ED Medications  sodium chloride 0.9 % bolus 1,000 mL (has no administration in time range)  ondansetron (ZOFRAN) injection 4 mg (has no administration in time range)     Initial Impression / Assessment and Plan / ED Course  I have reviewed the triage vital signs and the nursing notes.  Pertinent labs & imaging results that were available during  my care of the patient were reviewed by me and considered in my medical decision making (see chart for details).  Clinical Course as of May 19 1520  Thu May 20, 2018  1032 Patient here with 2 days of nausea vomiting diarrhea.  Vitals and exam normal.  Lab work back and unrevealing.  York Spaniel he is feeling little better so we will p.o. trial him here.   [MB]  1101 Patient tolerating p.o. here will discharge  with some Zofran.   [MB]    Clinical Course User Index [MB] Terrilee Files, MD         Final Clinical Impressions(s) / ED Diagnoses   Final diagnoses:  Nausea vomiting and diarrhea    ED Discharge Orders         Ordered    ondansetron (ZOFRAN) 4 MG tablet  Every 8 hours PRN     05/20/18 1042           Terrilee Files, MD 05/20/18 1523

## 2018-05-20 NOTE — ED Triage Notes (Signed)
Pt states he has had n/v/d since Tuesday and "cold sweats."

## 2018-05-20 NOTE — Discharge Instructions (Signed)
You were seen in the emergency department for nausea vomiting diarrhea.  Your exam and lab work were unremarkable.  You were given some IV fluids and nausea medicine with improvement in your symptoms.  We are sending you home with a prescription for some more nausea medicine if you need.  Please start with fluids and a bland diet.  Return if any worsening symptoms or concerns.

## 2018-05-20 NOTE — ED Notes (Signed)
Pt tolerated water well

## 2019-01-18 ENCOUNTER — Emergency Department (HOSPITAL_COMMUNITY)
Admission: EM | Admit: 2019-01-18 | Discharge: 2019-01-18 | Disposition: A | Discharge status: Home / Self Care | Payer: Medicaid Other | Attending: Emergency Medicine | Admitting: Emergency Medicine

## 2019-01-18 ENCOUNTER — Encounter (HOSPITAL_COMMUNITY): Payer: Self-pay

## 2019-01-18 ENCOUNTER — Other Ambulatory Visit: Payer: Self-pay

## 2019-01-18 DIAGNOSIS — J029 Acute pharyngitis, unspecified: Secondary | ICD-10-CM | POA: Insufficient documentation

## 2019-01-18 DIAGNOSIS — R21 Rash and other nonspecific skin eruption: Secondary | ICD-10-CM | POA: Insufficient documentation

## 2019-01-18 DIAGNOSIS — Z79899 Other long term (current) drug therapy: Secondary | ICD-10-CM | POA: Insufficient documentation

## 2019-01-18 DIAGNOSIS — F1410 Cocaine abuse, uncomplicated: Secondary | ICD-10-CM | POA: Insufficient documentation

## 2019-01-18 DIAGNOSIS — F1210 Cannabis abuse, uncomplicated: Secondary | ICD-10-CM | POA: Insufficient documentation

## 2019-01-18 DIAGNOSIS — F17210 Nicotine dependence, cigarettes, uncomplicated: Secondary | ICD-10-CM | POA: Insufficient documentation

## 2019-01-18 MED ORDER — DOXYCYCLINE HYCLATE 100 MG PO CAPS: 100.0000 mg | ORAL_CAPSULE | Freq: Two times a day (BID) | ORAL | 0 refills | Status: AC

## 2019-01-18 NOTE — ED Provider Notes (Signed)
[place] EMERGENCY DEPARTMENT Provider Note   CSN: [id number] Arrival date & time: 01/18/19  1823     History   Chief Complaint Chief Complaint  Patient presents with  . Rash    HPI John Moody is a 43 y.o. male with a past medical history of polysubstance abuse, alcohol abuse presenting to the ED with a chief complaint of rash.  Started noticing itchy and painful rash on the palms of his hands, in between his toes and began having a sore throat.  States that he has had the symptoms in the past.  Before the rash occurs, he has pain in these areas.  Patient states "they never figured out what caused it, one time they gave me some white pills that made it go away."  He denies any new soaps, lotions, detergents or any food ingestions.  He denies concern for STDs.     HPI  Past Medical History:  Diagnosis Date  . Anemia   . 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 Cocaine use disorder, severe, dependence ([place]) 11/24/2016  . Alcohol use disorder, severe, dependence ([place]) 11/24/2016  . Iron deficiency anemia 11/24/2016  . MDD (major depressive disorder), recurrent, severe, with psychosis ([place]) 11/22/2016    History reviewed. No pertinent surgical history.      Home Medications    Prior to Admission medications   Medication Sig Start Date End Date Taking? Authorizing Provider  doxycycline (VIBRAMYCIN) 100 MG capsule Take 1 capsule (100 mg total) by mouth 2 (two) times daily for 7 days. 01/18/19 01/25/19  Peng Thorstenson, PA-C  ferrous sulfate 325 (65 FE) MG tablet Take 1 tablet (325 mg total) by mouth 2 (two) times daily with a meal. For low iron 11/25/16   Lindell Spar I, NP  HYDROcodone-acetaminophen (NORCO) 5-325 MG tablet Take 1 tablet by mouth every 4 (four) hours as needed. 03/03/18   Daleen Bo, MD  methocarbamol (ROBAXIN) 500 MG tablet Take 1 tablet (500 mg total) by mouth 2 (two) times daily.  11/15/17   Nuala Alpha A, PA-C  naproxen (NAPROSYN) 500 MG tablet Take 1 tablet (500 mg total) by mouth 2 (two) times daily. 11/15/17   Nuala Alpha A, PA-C  ondansetron (ZOFRAN) 4 MG tablet Take 1 tablet (4 mg total) by mouth every 8 (eight) hours as needed for nausea or vomiting. 05/20/18   Hayden Rasmussen, MD  sertraline (ZOLOFT) 25 MG tablet Take 1 tablet (25 mg total) by mouth daily. For depression 11/26/16   Lindell Spar I, NP  traZODone (DESYREL) 50 MG tablet Take 1 tablet (50 mg) at bedtime 11/25/16   Encarnacion Slates, NP    Family History History reviewed. No pertinent family history.  Social History Social History   Tobacco Use  . Smoking status: Current Every Day Smoker    Packs/day: 0.50    Types: Cigarettes  . Smokeless tobacco: Never Used  Substance Use Topics  . Alcohol use: Yes    Alcohol/week: 35.0 standard drinks    Types: 35 Cans of beer per week  . Drug use: Yes    Types: Marijuana, Cocaine     Allergies   Patient has no known allergies.   Review of Systems Review of Systems  Constitutional: Negative for chills and fever.  HENT: Positive for sore throat.   Skin: Positive for rash.     Physical Exam Updated Vital Signs BP 111/78 (BP  Location: Right Arm)   Pulse 94   Temp 97.9 F (36.6 C) (Oral)   Resp 18   SpO2 99%   Physical Exam Vitals signs and nursing note reviewed.  Constitutional:      General: He is not in acute distress.    Appearance: He is well-developed. He is not diaphoretic.  HENT:     Head: Normocephalic and atraumatic.     Mouth/Throat:     Tongue: Lesions present.  Eyes:     General: No scleral icterus.    Conjunctiva/sclera: Conjunctivae normal.  Neck:     Musculoskeletal: Normal range of motion.  Pulmonary:     Effort: Pulmonary effort is normal. No respiratory distress.  Skin:    Findings: Rash present.     Comments: Erythematous rash noted on palms and in between digits of right foot.  Neurological:     Mental  Status: He is alert.        ED Treatments / Results  Labs (all labs ordered are listed, but only abnormal results are displayed) Labs Reviewed  RPR    EKG None  Radiology No results found.  Procedures Procedures (including critical care time)  Medications Ordered in ED Medications - No data to display   Initial Impression / Assessment and Plan / ED Course  I have reviewed the triage vital signs and the nursing notes.  Pertinent labs & imaging results that were available during my care of the patient were reviewed by me and considered in my medical decision making (see chart for details).        43 year old male presenting to the ED with a chief complaint of rash.  He was told in the past that he had SJS although this does not appear consistent with SJS.  Reports blisters in the palm of his hand and in between his toes as well as his throat.  Denies any anaphylaxis, angioedema, fever or known tick bites.  No sick contacts or similar symptoms.  Patient vital signs within normal limits.  Consider hand-foot-and-mouth disease but will cover for tickborne illness.  RPR sent.  Lab and return for worsening symptoms.   Patient is hemodynamically stable, in NAD, and able to ambulate in the ED. Evaluation does not show pathology that would require ongoing emergent intervention or inpatient treatment. I explained the diagnosis to the patient. Pain has been managed and has no complaints prior to discharge. Patient is comfortable with above plan and is stable for discharge at this time. All questions were answered prior to disposition. Strict return precautions for returning to the ED were discussed. Encouraged follow up with PCP.   An After Visit Summary was printed and given to the patient.   Portions of this note were generated with Scientist, clinical (histocompatibility and immunogenetics). Dictation errors may occur despite best attempts at proofreading.  Final Clinical Impressions(s) / ED Diagnoses   Final  diagnoses:  Rash and nonspecific skin eruption    ED Discharge Orders         Ordered    doxycycline (VIBRAMYCIN) 100 MG capsule  2 times daily     01/18/19 2033           Dietrich Pates, PA-C 01/18/19 2037    Melene Plan, DO 01/18/19 2042

## 2019-01-18 NOTE — ED Triage Notes (Signed)
Pt presents to ED w/blisters on his hands, feet and throat. 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

## 2019-01-18 NOTE — Discharge Instructions (Signed)
Return to the ED if you start to develop worsening symptoms, chest pain, shortness of breath, fever, blurry vision.

## 2019-01-19 LAB — RPR: RPR Ser Ql: NONREACTIVE

## 2020-12-25 ENCOUNTER — Emergency Department (HOSPITAL_COMMUNITY): Payer: Medicaid Other

## 2020-12-25 ENCOUNTER — Emergency Department (HOSPITAL_COMMUNITY): Payer: Medicaid Other | Admitting: Certified Registered"

## 2020-12-25 ENCOUNTER — Inpatient Hospital Stay (HOSPITAL_COMMUNITY)
Admission: EM | Admit: 2020-12-25 | Discharge: 2020-12-31 | DRG: 026 | Disposition: A | Discharge status: Home / Self Care | Payer: Medicaid Other | Attending: Neurological Surgery | Admitting: Neurological Surgery

## 2020-12-25 ENCOUNTER — Encounter (HOSPITAL_COMMUNITY)
Admission: EM | Disposition: A | Discharge status: Home / Self Care | Payer: Self-pay | Source: Home / Self Care | Attending: Neurological Surgery

## 2020-12-25 ENCOUNTER — Encounter (HOSPITAL_COMMUNITY): Payer: Self-pay

## 2020-12-25 DIAGNOSIS — K08419 Partial loss of teeth due to trauma, unspecified class: Secondary | ICD-10-CM

## 2020-12-25 DIAGNOSIS — F17210 Nicotine dependence, cigarettes, uncomplicated: Secondary | ICD-10-CM | POA: Diagnosis present

## 2020-12-25 DIAGNOSIS — T1490XA Injury, unspecified, initial encounter: Secondary | ICD-10-CM

## 2020-12-25 DIAGNOSIS — Z79899 Other long term (current) drug therapy: Secondary | ICD-10-CM | POA: Diagnosis not present

## 2020-12-25 DIAGNOSIS — S020XXA Fracture of vault of skull, initial encounter for closed fracture: Principal | ICD-10-CM | POA: Diagnosis present

## 2020-12-25 DIAGNOSIS — Z20822 Contact with and (suspected) exposure to covid-19: Secondary | ICD-10-CM | POA: Diagnosis present

## 2020-12-25 DIAGNOSIS — Z23 Encounter for immunization: Secondary | ICD-10-CM | POA: Diagnosis not present

## 2020-12-25 DIAGNOSIS — X58XXXA Exposure to other specified factors, initial encounter: Secondary | ICD-10-CM | POA: Diagnosis present

## 2020-12-25 DIAGNOSIS — L511 Stevens-Johnson syndrome: Secondary | ICD-10-CM | POA: Diagnosis present

## 2020-12-25 DIAGNOSIS — Z79891 Long term (current) use of opiate analgesic: Secondary | ICD-10-CM

## 2020-12-25 DIAGNOSIS — S0291XA Unspecified fracture of skull, initial encounter for closed fracture: Secondary | ICD-10-CM | POA: Diagnosis present

## 2020-12-25 HISTORY — PX: CRANIOTOMY: SHX93

## 2020-12-25 LAB — COMPREHENSIVE METABOLIC PANEL
ALT: 11 U/L (ref 0–44)
AST: 27 U/L (ref 15–41)
Albumin: 3.8 g/dL (ref 3.5–5.0)
Alkaline Phosphatase: 71 U/L (ref 38–126)
Anion gap: 10 (ref 5–15)
BUN: 11 mg/dL (ref 6–20)
CO2: 29 mmol/L (ref 22–32)
Calcium: 9.6 mg/dL (ref 8.9–10.3)
Chloride: 99 mmol/L (ref 98–111)
Creatinine, Ser: 0.94 mg/dL (ref 0.61–1.24)
GFR, Estimated: >60 mL/min (ref >60)
Glucose, Bld: 101 mg/dL — ABNORMAL HIGH (ref 70–99)
Potassium: 3.8 mmol/L (ref 3.5–5.1)
Sodium: 138 mmol/L (ref 135–145)
Total Bilirubin: 1.4 mg/dL — ABNORMAL HIGH (ref 0.3–1.2)
Total Protein: 8 g/dL (ref 6.5–8.1)

## 2020-12-25 LAB — CBC
HCT: 34 % — ABNORMAL LOW (ref 39.0–52.0)
Hemoglobin: 11.1 g/dL — ABNORMAL LOW (ref 13.0–17.0)
MCH: 26.2 pg (ref 26.0–34.0)
MCHC: 32.6 g/dL (ref 30.0–36.0)
MCV: 80.4 fL (ref 80.0–100.0)
Platelets: 323 10*3/uL (ref 150–400)
RBC: 4.23 MIL/uL (ref 4.22–5.81)
RDW: 16.6 % — ABNORMAL HIGH (ref 11.5–15.5)
WBC: 11.2 10*3/uL — ABNORMAL HIGH (ref 4.0–10.5)
nRBC: 0 % (ref 0.0–0.2)

## 2020-12-25 LAB — TYPE AND SCREEN
ABO/RH(D): B POS
Antibody Screen: NEGATIVE

## 2020-12-25 LAB — CBC WITH DIFFERENTIAL/PLATELET
Abs Immature Granulocytes: 0.07 10*3/uL (ref 0.00–0.07)
Basophils Absolute: 0 10*3/uL (ref 0.0–0.1)
Basophils Relative: 0 %
Eosinophils Absolute: 0.2 10*3/uL (ref 0.0–0.5)
Eosinophils Relative: 2 %
HCT: 40.2 % (ref 39.0–52.0)
Hemoglobin: 13.3 g/dL (ref 13.0–17.0)
Immature Granulocytes: 1 %
Lymphocytes Relative: 11 %
Lymphs Abs: 1.3 10*3/uL (ref 0.7–4.0)
MCH: 26.3 pg (ref 26.0–34.0)
MCHC: 33.1 g/dL (ref 30.0–36.0)
MCV: 79.4 fL — ABNORMAL LOW (ref 80.0–100.0)
Monocytes Absolute: 0.9 10*3/uL (ref 0.1–1.0)
Monocytes Relative: 8 %
Neutro Abs: 9.7 10*3/uL — ABNORMAL HIGH (ref 1.7–7.7)
Neutrophils Relative %: 78 %
Platelets: 392 10*3/uL (ref 150–400)
RBC: 5.06 MIL/uL (ref 4.22–5.81)
RDW: 16.8 % — ABNORMAL HIGH (ref 11.5–15.5)
WBC: 12.3 10*3/uL — ABNORMAL HIGH (ref 4.0–10.5)
nRBC: 0 % (ref 0.0–0.2)

## 2020-12-25 LAB — BASIC METABOLIC PANEL
Anion gap: 9 (ref 5–15)
BUN: 10 mg/dL (ref 6–20)
CO2: 23 mmol/L (ref 22–32)
Calcium: 8.1 mg/dL — ABNORMAL LOW (ref 8.9–10.3)
Chloride: 109 mmol/L (ref 98–111)
Creatinine, Ser: 0.88 mg/dL (ref 0.61–1.24)
GFR, Estimated: >60 mL/min (ref >60)
Glucose, Bld: 121 mg/dL — ABNORMAL HIGH (ref 70–99)
Potassium: 3.7 mmol/L (ref 3.5–5.1)
Sodium: 141 mmol/L (ref 135–145)

## 2020-12-25 LAB — RESP PANEL BY RT-PCR (FLU A&B, COVID) ARPGX2
Influenza A by PCR: NEGATIVE
Influenza B by PCR: NEGATIVE
SARS Coronavirus 2 by RT PCR: NEGATIVE

## 2020-12-25 LAB — ETHANOL: Alcohol, Ethyl (B): <10 mg/dL (ref <10)

## 2020-12-25 LAB — MRSA NEXT GEN BY PCR, NASAL: MRSA by PCR Next Gen: NOT DETECTED

## 2020-12-25 SURGERY — CRANIOTOMY HEMATOMA EVACUATION SUBDURAL
Anesthesia: General | Site: Head | Laterality: Left

## 2020-12-25 MED ORDER — SODIUM CHLORIDE 0.9 % IV SOLN
INTRAVENOUS | Status: DC | PRN
  Administered 2020-12-25: 500 mg via INTRAVENOUS

## 2020-12-25 MED ORDER — MORPHINE SULFATE (PF) 2 MG/ML IV SOLN
1.0000 mg | INTRAVENOUS | Status: DC | PRN
Start: 2020-12-25 — End: 2020-12-31

## 2020-12-25 MED ORDER — ORAL CARE MOUTH RINSE: 15.0000 mL | Freq: Once | OROMUCOSAL | Status: DC

## 2020-12-25 MED ORDER — DOCUSATE SODIUM 100 MG PO CAPS
100.0000 mg | ORAL_CAPSULE | Freq: Two times a day (BID) | ORAL | Status: DC
  Administered 2020-12-26 – 2020-12-31 (×8): 100 mg via ORAL
  Filled 2020-12-25 (×10): qty 1

## 2020-12-25 MED ORDER — SENNA 8.6 MG PO TABS
1.0000 | ORAL_TABLET | Freq: Two times a day (BID) | ORAL | Status: DC
  Administered 2020-12-26 – 2020-12-31 (×8): 8.6 mg via ORAL
  Filled 2020-12-25 (×10): qty 1

## 2020-12-25 MED ORDER — PROPOFOL 10 MG/ML IV BOLUS
INTRAVENOUS | Status: DC | PRN
  Administered 2020-12-25: 30 mg via INTRAVENOUS
  Administered 2020-12-25: 90 mg via INTRAVENOUS

## 2020-12-25 MED ORDER — LEVETIRACETAM IN NACL 500 MG/100ML IV SOLN
500.0000 mg | Freq: Two times a day (BID) | INTRAVENOUS | Status: DC
Start: 2020-12-25 — End: 2020-12-29
  Administered 2020-12-25 – 2020-12-29 (×7): 500 mg via INTRAVENOUS
  Filled 2020-12-25 (×7): qty 100

## 2020-12-25 MED ORDER — PROMETHAZINE HCL 25 MG PO TABS: 12.5000 mg | ORAL_TABLET | ORAL | Status: DC | PRN

## 2020-12-25 MED ORDER — ONDANSETRON HCL 4 MG PO TABS
4.0000 mg | ORAL_TABLET | ORAL | Status: DC | PRN
Start: 2020-12-25 — End: 2020-12-31

## 2020-12-25 MED ORDER — PANTOPRAZOLE SODIUM 40 MG IV SOLR
40.0000 mg | Freq: Every day | INTRAVENOUS | Status: DC
  Administered 2020-12-26: 40 mg via INTRAVENOUS
  Filled 2020-12-25: qty 40

## 2020-12-25 MED ORDER — TETANUS-DIPHTH-ACELL PERTUSSIS 5-2.5-18.5 LF-MCG/0.5 IM SUSY
0.5000 mL | PREFILLED_SYRINGE | Freq: Once | INTRAMUSCULAR | Status: AC
  Administered 2020-12-25: 0.5 mL via INTRAMUSCULAR
  Filled 2020-12-25: qty 0.5

## 2020-12-25 MED ORDER — ORAL CARE MOUTH RINSE: 15.0000 mL | Freq: Once | OROMUCOSAL | Status: AC

## 2020-12-25 MED ORDER — LACTATED RINGERS IV SOLN
INTRAVENOUS | Status: DC
  Administered 2020-12-26 (×2): 125 mL/h via INTRAVENOUS

## 2020-12-25 MED ORDER — THROMBIN 20000 UNITS EX SOLR
CUTANEOUS | Status: AC
  Filled 2020-12-25: qty 20000

## 2020-12-25 MED ORDER — ONDANSETRON HCL 4 MG/2ML IJ SOLN: 4.0000 mg | INTRAMUSCULAR | Status: DC | PRN

## 2020-12-25 MED ORDER — ROCURONIUM BROMIDE 10 MG/ML (PF) SYRINGE
PREFILLED_SYRINGE | INTRAVENOUS | Status: AC
  Filled 2020-12-25: qty 10

## 2020-12-25 MED ORDER — DEXAMETHASONE SODIUM PHOSPHATE 10 MG/ML IJ SOLN
INTRAMUSCULAR | Status: AC
  Filled 2020-12-25: qty 1

## 2020-12-25 MED ORDER — LIDOCAINE 2% (20 MG/ML) 5 ML SYRINGE
INTRAMUSCULAR | Status: AC
  Filled 2020-12-25: qty 5

## 2020-12-25 MED ORDER — ONDANSETRON HCL 4 MG/2ML IJ SOLN
INTRAMUSCULAR | Status: DC | PRN
  Administered 2020-12-25: 4 mg via INTRAVENOUS

## 2020-12-25 MED ORDER — BUPIVACAINE HCL (PF) 0.5 % IJ SOLN
INTRAMUSCULAR | Status: AC
  Filled 2020-12-25: qty 30

## 2020-12-25 MED ORDER — SODIUM CHLORIDE 0.9 % IV SOLN: INTRAVENOUS | Status: DC

## 2020-12-25 MED ORDER — FENTANYL CITRATE (PF) 250 MCG/5ML IJ SOLN
INTRAMUSCULAR | Status: DC | PRN
  Administered 2020-12-25: 50 ug via INTRAVENOUS
  Administered 2020-12-25: 100 ug via INTRAVENOUS
  Administered 2020-12-25 (×2): 50 ug via INTRAVENOUS

## 2020-12-25 MED ORDER — CHLORHEXIDINE GLUCONATE 0.12 % MT SOLN: 15.0000 mL | Freq: Once | OROMUCOSAL | Status: AC

## 2020-12-25 MED ORDER — DEXAMETHASONE SODIUM PHOSPHATE 10 MG/ML IJ SOLN
INTRAMUSCULAR | Status: DC | PRN
  Administered 2020-12-25: 10 mg via INTRAVENOUS

## 2020-12-25 MED ORDER — LABETALOL HCL 5 MG/ML IV SOLN: 10.0000 mg | INTRAVENOUS | Status: DC | PRN

## 2020-12-25 MED ORDER — LIDOCAINE-EPINEPHRINE 2 %-1:100000 IJ SOLN
INTRAMUSCULAR | Status: AC
  Filled 2020-12-25: qty 1

## 2020-12-25 MED ORDER — BACITRACIN ZINC 500 UNIT/GM EX OINT
TOPICAL_OINTMENT | CUTANEOUS | Status: AC
  Filled 2020-12-25: qty 28.35

## 2020-12-25 MED ORDER — 0.9 % SODIUM CHLORIDE (POUR BTL) OPTIME
TOPICAL | Status: DC | PRN
  Administered 2020-12-25: 1000 mL

## 2020-12-25 MED ORDER — ALBUMIN HUMAN 5 % IV SOLN: INTRAVENOUS | Status: DC | PRN

## 2020-12-25 MED ORDER — SUCCINYLCHOLINE CHLORIDE 200 MG/10ML IV SOSY
PREFILLED_SYRINGE | INTRAVENOUS | Status: DC | PRN
  Administered 2020-12-25: 120 mg via INTRAVENOUS

## 2020-12-25 MED ORDER — ESMOLOL HCL 100 MG/10ML IV SOLN
INTRAVENOUS | Status: DC | PRN
  Administered 2020-12-25: 20 mg via INTRAVENOUS

## 2020-12-25 MED ORDER — CEFAZOLIN SODIUM-DEXTROSE 2-4 GM/100ML-% IV SOLN
INTRAVENOUS | Status: AC
  Filled 2020-12-25: qty 100

## 2020-12-25 MED ORDER — POLYETHYLENE GLYCOL 3350 17 G PO PACK: 17.0000 g | PACK | Freq: Every day | ORAL | Status: DC | PRN

## 2020-12-25 MED ORDER — LIDOCAINE-EPINEPHRINE 1 %-1:100000 IJ SOLN
INTRAMUSCULAR | Status: DC | PRN
  Administered 2020-12-25: 5 mL

## 2020-12-25 MED ORDER — BISACODYL 10 MG RE SUPP: 10.0000 mg | Freq: Every day | RECTAL | Status: DC | PRN

## 2020-12-25 MED ORDER — ROCURONIUM BROMIDE 10 MG/ML (PF) SYRINGE
PREFILLED_SYRINGE | INTRAVENOUS | Status: DC | PRN
  Administered 2020-12-25: 100 mg via INTRAVENOUS

## 2020-12-25 MED ORDER — PROPOFOL 10 MG/ML IV BOLUS
INTRAVENOUS | Status: AC
  Filled 2020-12-25: qty 20

## 2020-12-25 MED ORDER — FLEET ENEMA 7-19 GM/118ML RE ENEM: 1.0000 | ENEMA | Freq: Once | RECTAL | Status: DC | PRN

## 2020-12-25 MED ORDER — LEVETIRACETAM IN NACL 500 MG/100ML IV SOLN
500.0000 mg | INTRAVENOUS | Status: DC
  Filled 2020-12-25: qty 100

## 2020-12-25 MED ORDER — SUCCINYLCHOLINE CHLORIDE 200 MG/10ML IV SOSY
PREFILLED_SYRINGE | INTRAVENOUS | Status: AC
  Filled 2020-12-25: qty 10

## 2020-12-25 MED ORDER — HYDROCODONE-ACETAMINOPHEN 5-325 MG PO TABS
1.0000 | ORAL_TABLET | ORAL | Status: DC | PRN
Start: 2020-12-25 — End: 2020-12-31
  Administered 2020-12-27 – 2020-12-31 (×4): 1 via ORAL
  Filled 2020-12-25 (×4): qty 1

## 2020-12-25 MED ORDER — PHENYLEPHRINE HCL-NACL 20-0.9 MG/250ML-% IV SOLN
INTRAVENOUS | Status: DC | PRN
Start: 2020-12-25 — End: 2020-12-25
  Administered 2020-12-25: 50 ug/min via INTRAVENOUS

## 2020-12-25 MED ORDER — BUPIVACAINE HCL (PF) 0.5 % IJ SOLN
INTRAMUSCULAR | Status: DC | PRN
  Administered 2020-12-25: 5 mL

## 2020-12-25 MED ORDER — BACITRACIN ZINC 500 UNIT/GM EX OINT
TOPICAL_OINTMENT | CUTANEOUS | Status: DC | PRN
  Administered 2020-12-25: 1 via TOPICAL

## 2020-12-25 MED ORDER — CHLORHEXIDINE GLUCONATE 0.12 % MT SOLN: 15.0000 mL | Freq: Once | OROMUCOSAL | Status: DC

## 2020-12-25 MED ORDER — CEFAZOLIN SODIUM-DEXTROSE 2-3 GM-%(50ML) IV SOLR
INTRAVENOUS | Status: DC | PRN
  Administered 2020-12-25: 2 g via INTRAVENOUS

## 2020-12-25 MED ORDER — CHLORHEXIDINE GLUCONATE 0.12 % MT SOLN
OROMUCOSAL | Status: AC
  Administered 2020-12-25: 15 mL via OROMUCOSAL
  Filled 2020-12-25: qty 15

## 2020-12-25 MED ORDER — FENTANYL CITRATE (PF) 250 MCG/5ML IJ SOLN
INTRAMUSCULAR | Status: AC
  Filled 2020-12-25: qty 5

## 2020-12-25 MED ORDER — CEFAZOLIN SODIUM-DEXTROSE 1-4 GM/50ML-% IV SOLN
1.0000 g | Freq: Three times a day (TID) | INTRAVENOUS | Status: AC
Start: 2020-12-25 — End: 2020-12-26
  Administered 2020-12-25 – 2020-12-26 (×2): 1 g via INTRAVENOUS
  Filled 2020-12-25 (×2): qty 50

## 2020-12-25 MED ORDER — THROMBIN 20000 UNITS EX SOLR
CUTANEOUS | Status: DC | PRN
  Administered 2020-12-25: 20 mL

## 2020-12-25 SURGICAL SUPPLY — 60 items
BAG COUNTER SPONGE SURGICOUNT (BAG) ×2 IMPLANT
BASKET BONE COLLECTION (BASKET) IMPLANT
BATTERY IQ STERILE (MISCELLANEOUS) ×2 IMPLANT
BIT DRILL WIRE PASS 1.3MM (BIT) IMPLANT
BNDG GAUZE ELAST 4 BULKY (GAUZE/BANDAGES/DRESSINGS) ×2 IMPLANT
BNDG STRETCH 4X75 NS LF (GAUZE/BANDAGES/DRESSINGS) ×2 IMPLANT
BUR ACORN 6.0 PRECISION (BURR) ×2 IMPLANT
BUR SPIRAL ROUTER 2.3 (BUR) IMPLANT
CANISTER SUCT 3000ML PPV (MISCELLANEOUS) ×2 IMPLANT
CLIP VESOCCLUDE MED 6/CT (CLIP) IMPLANT
COVER BURR HOLE UNIV 10 (Orthopedic Implant) ×2 IMPLANT
DECANTER SPIKE VIAL GLASS SM (MISCELLANEOUS) ×2 IMPLANT
DRAIN CHANNEL 10M FLAT 3/4 FLT (DRAIN) IMPLANT
DRAIN PENROSE 1/2X12 LTX STRL (WOUND CARE) IMPLANT
DRAPE SURG IRRIG POUCH 19X23 (DRAPES) ×2 IMPLANT
DRAPE WARM FLUID 44X44 (DRAPES) ×2 IMPLANT
DRILL WIRE PASS 1.3MM (BIT)
DRSG ADAPTIC 3X8 NADH LF (GAUZE/BANDAGES/DRESSINGS) IMPLANT
DRSG PAD ABDOMINAL 8X10 ST (GAUZE/BANDAGES/DRESSINGS) IMPLANT
DURAPREP 6ML APPLICATOR 50/CS (WOUND CARE) ×2 IMPLANT
ELECT REM PT RETURN 9FT ADLT (ELECTROSURGICAL) ×2
ELECTRODE REM PT RTRN 9FT ADLT (ELECTROSURGICAL) ×1 IMPLANT
EVACUATOR SILICONE 100CC (DRAIN) IMPLANT
GAUZE 4X4 16PLY ~~LOC~~+RFID DBL (SPONGE) IMPLANT
GAUZE SPONGE 4X4 12PLY STRL (GAUZE/BANDAGES/DRESSINGS) ×2 IMPLANT
GLOVE EXAM NITRILE XL STR (GLOVE) IMPLANT
GLOVE SURG LTX SZ8.5 (GLOVE) ×2 IMPLANT
GLOVE SURG UNDER POLY LF SZ8.5 (GLOVE) ×2 IMPLANT
GOWN STRL REUS W/ TWL LRG LVL3 (GOWN DISPOSABLE) IMPLANT
GOWN STRL REUS W/ TWL XL LVL3 (GOWN DISPOSABLE) ×1 IMPLANT
GOWN STRL REUS W/TWL 2XL LVL3 (GOWN DISPOSABLE) ×2 IMPLANT
GOWN STRL REUS W/TWL LRG LVL3 (GOWN DISPOSABLE)
GOWN STRL REUS W/TWL XL LVL3 (GOWN DISPOSABLE) ×1
HEMOSTAT SURGICEL 2X14 (HEMOSTASIS) IMPLANT
KIT BASIN OR (CUSTOM PROCEDURE TRAY) ×2 IMPLANT
KIT TURNOVER KIT B (KITS) ×2 IMPLANT
NEEDLE HYPO 22GX1.5 SAFETY (NEEDLE) ×2 IMPLANT
NS IRRIG 1000ML POUR BTL (IV SOLUTION) ×2 IMPLANT
PACK BATTERY CMF DISP FOR DVR (ORTHOPEDIC DISPOSABLE SUPPLIES) ×2 IMPLANT
PACK CRANIOTOMY CUSTOM (CUSTOM PROCEDURE TRAY) ×2 IMPLANT
PAD ABD 8X10 STRL (GAUZE/BANDAGES/DRESSINGS) ×2 IMPLANT
PATTIES SURGICAL .5 X.5 (GAUZE/BANDAGES/DRESSINGS) IMPLANT
PATTIES SURGICAL .5 X3 (DISPOSABLE) IMPLANT
PATTIES SURGICAL 1X1 (DISPOSABLE) IMPLANT
PIN MAYFIELD SKULL DISP (PIN) IMPLANT
PLATE BONE 12 2H TARGET XL (Plate) ×20 IMPLANT
SCREW UNIII AXS SD 1.5X4 (Screw) ×50 IMPLANT
SPECIMEN JAR SMALL (MISCELLANEOUS) IMPLANT
SPONGE NEURO XRAY DETECT 1X3 (DISPOSABLE) IMPLANT
SPONGE SURGIFOAM ABS GEL 100 (HEMOSTASIS) IMPLANT
STAPLER SKIN PROX WIDE 3.9 (STAPLE) ×2 IMPLANT
SUT ETHILON 3 0 FSL (SUTURE) IMPLANT
SUT NURALON 4 0 TR CR/8 (SUTURE) ×4 IMPLANT
SUT VIC AB 2-0 CP2 18 (SUTURE) ×4 IMPLANT
SUT VIC AB 4-0 RB1 18 (SUTURE) ×2 IMPLANT
TOWEL GREEN STERILE (TOWEL DISPOSABLE) ×2 IMPLANT
TOWEL GREEN STERILE FF (TOWEL DISPOSABLE) ×2 IMPLANT
TRAY FOLEY MTR SLVR 16FR STAT (SET/KITS/TRAYS/PACK) IMPLANT
UNDERPAD 30X36 HEAVY ABSORB (UNDERPADS AND DIAPERS) IMPLANT
WATER STERILE IRR 1000ML POUR (IV SOLUTION) ×2 IMPLANT

## 2020-12-25 NOTE — Op Note (Signed)
Date of surgery December 25, 2020 Preoperative diagnosis: Left parietal depressed skull fracture Postoperative diagnosis: The same Procedure: Left parietal craniotomy and elevation of depressed skull fracture. Surgeon: Barnett Abu First Assistant: None Anesthesia: General endotracheal Indications: John Moody presented to the emergency room being found with an altered state of consciousness.  He was found to have a depressed skull fracture and left parietal region on the CT scan with a contrecoup contusion.  The depressed skull fracture measured a diameter of 5 cm and was 1-1/2 cm deep with breakthrough of both tables of the skull.  This was impacting the bone and appeared to lacerate the dura.  Surgical repair was advised.  Procedure: The patient was brought to the operating room supine on the stretcher after the smooth induction of general endotracheal anesthesia he was placed in the 3 pin headrest and carefully turned into the right lateral decubitus position.  Bony prominences were appropriately padded and protected.  Then the head was shaved completely of the thick dreads with a substantial dead skin underneath it creating a thick mat.  The scalp was then scrubbed with alcohol to cleanse it.  At this time it was noted that there was a small laceration in the vertex of the scalp on the right side that measured approximately 2 cm in length.  This had the appearance of having been at least there for 24 hours or more had impacted blood in hair.  It was debrided with a small scrub brush and cleansed with alcohol also but at this point it is not felt that closing it was in the patient's best interest.  It had minimal bleeding at this time already.  With that the area of the depressed skull fracture was easily visible with a boggy scalp overlying it.  A vertical incision was made from behind the ear to the vertex of the scalp overlying the mid position of the depressed fragment.  Then after placing towels  and draping sterilely the skin was infiltrated with 10 cc of lidocaine with epinephrine mixed 50-50 with half percent Marcaine.  A vertical incision was made in this region and taken down through the galea.  The depressed fragments of bone were then easily identified.  Once the galea was resected from the depressed fragments of bone one by one the fragments of bone could be move and then removed and the fragments were thus elevated there was a laceration of the dura in the midportion that was approximately 1-1/2 cm in length.  Some devitalized brain was cauterized and removed from underneath it.  The dura was well approximated.  Tack up sutures were then placed around the perimetry and the individual bone fragments were then tied together using a series of dog bone plates and a star plate to secure the bone fragments into a single craniotomy piece.  Then by shaping the opening and undercutting the fragmented bone on the undersurface I was able to refit the plate and a singular central tack up suture was used to secure the dura to the undersurface of the craniotomy plate.  A series of dog bone plates was used to secure the plate in position.  With this the galea was closed with 2-0 Vicryl after irrigating copiously with antibiotic irrigating solution and surgical staples were used in the scalp.  Blood loss for the procedure was estimated about 100 cc.  Patient returned to recovery room in stable condition.

## 2020-12-25 NOTE — H&P (Signed)
John Moody is an 45 y.o. male.   Chief Complaint: Depressed skull fracture left parietal HPI: John Moody is a 45 year old right-handed individual who does not recall what happened to them.  His parents came into check on him this morning and found him to be lethargic and confused and summoned him to the emergency room.  CT scan of the head demonstrates the presence of a large parietal depressed skull fracture with a contusion immediately underneath this and evidence of a contrecoup contusion in the right posterior temporal region.  The patient is arousable and will follow some commands weakly he is capable of moving all 4 extremities.  I was contacted at [phone] by page and talk to Dr. Bruce Donath at approximately 10-11 and saw the patient at approximately 1115.  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  The patient's mother and father were present in the emergency department and I described the depressed skull fracture in the left parietal region and explained to them the necessity of elevating this fracture via laparoscopic and operation.  He is now admitted for that.  Past Medical History:  Diagnosis Date   Anemia    Stevens-Johnson disease (HCC)     No past surgical history on file.  No family history on file. Social History:  reports that he has been smoking cigarettes. He has been smoking an average of .5 packs per day. He has never used smokeless tobacco. He reports current alcohol use of about 35.0 standard drinks per week. He reports current drug use. Drugs: Marijuana and Cocaine.  Allergies: No Known Allergies  No medications prior to admission.    Results for orders placed or performed during the hospital encounter of 12/25/20 (from the past 48 hour(s))  CBC with Differential     Status: Abnormal   Collection Time:  12/25/20  9:40 AM  Result Value Ref Range   WBC 12.3 (H) 4.0 - 10.5 K/uL   RBC 5.06 4.22 - 5.81 MIL/uL   Hemoglobin 13.3 13.0 - 17.0 g/dL   HCT 58.0 99.8 - 33.8 %   MCV 79.4 (L) 80.0 - 100.0 fL   MCH 26.3 26.0 - 34.0 pg   MCHC 33.1 30.0 - 36.0 g/dL   RDW 25.0 (H) 53.9 - 76.7 %   Platelets 392 150 - 400 K/uL   nRBC 0.0 0.0 - 0.2 %   Neutrophils Relative % 78 %   Neutro Abs 9.7 (H) 1.7 - 7.7 K/uL   Lymphocytes Relative 11 %   Lymphs Abs 1.3 0.7 - 4.0 K/uL   Monocytes Relative 8 %   Monocytes Absolute 0.9 0.1 - 1.0 K/uL   Eosinophils Relative 2 %   Eosinophils Absolute 0.2 0.0 - 0.5 K/uL   Basophils Relative 0 %   Basophils Absolute 0.0 0.0 - 0.1 K/uL   Immature Granulocytes 1 %   Abs Immature Granulocytes 0.07 0.00 - 0.07 K/uL    Comment: Performed at [facility], [address] [place], [place] [address]  Comprehensive metabolic panel     Status: Abnormal   Collection Time: 12/25/20  9:40 AM  Result Value Ref Range   Sodium 138 135 - 145 mmol/L   Potassium 3.8 3.5 - 5.1 mmol/L   Chloride 99 98 - 111 mmol/L   CO2 29 22 - 32  mmol/L   Glucose, Bld 101 (H) 70 - 99 mg/dL    Comment: Glucose reference range applies only to samples taken after fasting for at least 8 hours.   BUN 11 6 - 20 mg/dL   Creatinine, Ser 5.39 0.61 - 1.24 mg/dL   Calcium 9.6 8.9 - 76.7 mg/dL   Total Protein 8.0 6.5 - 8.1 g/dL   Albumin 3.8 3.5 - 5.0 g/dL   AST 27 15 - 41 U/L   ALT 11 0 - 44 U/L   Alkaline Phosphatase 71 38 - 126 U/L   Total Bilirubin 1.4 (H) 0.3 - 1.2 mg/dL   GFR, Estimated >34 >19 mL/min    Comment: (NOTE) Calculated using the CKD-EPI Creatinine Equation (2021)    Anion gap 10 5 - 15    Comment: Performed at [facility], [address], [place], [place] [address]  Resp Panel by RT-PCR (Flu A&B, Covid) Nasopharyngeal Swab     Status: None   Collection Time: 12/25/20 10:29 AM   Specimen: Nasopharyngeal Swab; Nasopharyngeal(NP) swabs in vial transport medium  Result  Value Ref Range   SARS Coronavirus 2 by RT PCR NEGATIVE NEGATIVE    Comment: (NOTE) SARS-CoV-2 target nucleic acids are NOT DETECTED.  The SARS-CoV-2 RNA is generally detectable in upper respiratory specimens during the acute phase of infection. The lowest concentration of SARS-CoV-2 viral copies this assay can detect is 138 copies/mL. A negative result does not preclude SARS-Cov-2 infection and should not be used as the sole basis for treatment or other patient management decisions. A negative result may occur with  improper specimen collection/handling, submission of specimen other than nasopharyngeal swab, presence of viral mutation(s) within the areas targeted by this assay, and inadequate number of viral copies(<138 copies/mL). A negative result must be combined with clinical observations, patient history, and epidemiological information. The expected result is Negative.  Fact Sheet for Patients:  BloggerCourse.com  Fact Sheet for Healthcare Providers:  SeriousBroker.it  This test is no t yet approved or cleared by the [place] FDA and  has been authorized for detection and/or diagnosis of SARS-CoV-2 by FDA under an Emergency Use Authorization (EUA). This EUA will remain  in effect (meaning this test can be used) for the duration of the COVID-19 declaration under Section 564(b)(1) of the Act, 21 U.S.C.section 360bbb-3(b)(1), unless the authorization is terminated  or revoked sooner.       Influenza A by PCR NEGATIVE NEGATIVE   Influenza B by PCR NEGATIVE NEGATIVE    Comment: (NOTE) The Xpert Xpress SARS-CoV-2/FLU/RSV plus assay is intended as an aid in the diagnosis of influenza from Nasopharyngeal swab specimens and should not be used as a sole basis for treatment. Nasal washings and aspirates are unacceptable for Xpert Xpress SARS-CoV-2/FLU/RSV testing.  Fact Sheet for  Patients: BloggerCourse.com  Fact Sheet for Healthcare Providers: SeriousBroker.it  This test is not yet approved or cleared by the [place] FDA and has been authorized for detection and/or diagnosis of SARS-CoV-2 by FDA under an Emergency Use Authorization (EUA). This EUA will remain in effect (meaning this test can be used) for the duration of the COVID-19 declaration under Section 564(b)(1) of the Act, 21 U.S.C. section 360bbb-3(b)(1), unless the authorization is terminated or revoked.  Performed at [facility], [address], [place], [place] [address]   Ethanol     Status: None   Collection Time: 12/25/20 10:44 AM  Result Value Ref Range   Alcohol, Ethyl (B) <10 <  10 mg/dL    Comment: (NOTE) Lowest detectable limit for serum alcohol is 10 mg/dL.  For medical purposes only. Performed at [facility], [address], [place], [place] [address]    CT Head Wo Contrast  Result Date: 12/25/2020 CLINICAL DATA:  Confusion. Fell. EXAM: CT HEAD WITHOUT CONTRAST CT CERVICAL SPINE WITHOUT CONTRAST TECHNIQUE: Multidetector CT imaging of the head and cervical spine was performed following the standard protocol without intravenous contrast. Multiplanar CT image reconstructions of the cervical spine were also generated. COMPARISON:  None. FINDINGS: CT HEAD FINDINGS Brain: There is a depressed comminuted left posterior parietal/occipital skull fracture with associated adjacent hemorrhagic contusions in the brain. There also hemorrhagic contusions and both temporal lobes just anterior to the temporal bones. Small bilateral subdural hematomas are also noted. Suspect an interhemispheric subdural hematoma anteriorly. The ventricles are in the midline without mass effect or shift. I do not see any findings for downward transtentorial herniation. The gray-white differentiation is maintained. No findings for hemispheric infarction. The  brainstem and cerebellum are grossly normal. Vascular: No significant vascular calcifications or hyperdense vessels. Skull: Comminuted and depressed left posterior parietal/occipital skull fracture also involving the left temporal bone with extension into the anterior mastoid air cells with associated small amount of pneumocephalus. Fluid in the right half of the sphenoid sinus suspicious for basilar skull fracture likely on the right side on image number 15/4. Sinuses/Orbits: Fluid in the right half of the sphenoid sinus. The other paranasal sinuses are clear. Small left-sided mastoid effusions and some fluid in the left middle ear cavity. Other: Fairly extensive left-sided scalp hematoma and wound. CT CERVICAL SPINE FINDINGS Alignment: Normal Skull base and vertebrae: No acute fracture. No primary bone lesion or focal pathologic process. Soft tissues and spinal canal: No prevertebral fluid or swelling. No visible canal hematoma. Disc levels:  No disc protrusions, spinal or foraminal stenosis. Upper chest: The lung apices are grossly clear. Emphysematous changes are noted. Other: None IMPRESSION: 1. Comminuted and depressed left posterior parietal/occipital/temporal skull fracture with associated adjacent hemorrhagic contusions in the brain. There are also hemorrhagic contusions involving both temporal lobes. 2. Small bilateral subdural hematomas, posterior parietal/occipital. 3. Suspect an interhemispheric subdural hematoma anteriorly. 4. Probable right-sided skull base fracture. 5. Normal alignment of the cervical vertebral bodies and no acute cervical spine fracture. These results were called by telephone at the time of interpretation on 12/25/2020 at 10:23 am to provider Mertha Baars , who verbally acknowledged these results. Electronically Signed   By: Rudie Meyer M.D.   On: 12/25/2020 10:25   CT Cervical Spine Wo Contrast  Result Date: 12/25/2020 CLINICAL DATA:  Confusion. Fell. EXAM: CT HEAD WITHOUT  CONTRAST CT CERVICAL SPINE WITHOUT CONTRAST TECHNIQUE: Multidetector CT imaging of the head and cervical spine was performed following the standard protocol without intravenous contrast. Multiplanar CT image reconstructions of the cervical spine were also generated. COMPARISON:  None. FINDINGS: CT HEAD FINDINGS Brain: There is a depressed comminuted left posterior parietal/occipital skull fracture with associated adjacent hemorrhagic contusions in the brain. There also hemorrhagic contusions and both temporal lobes just anterior to the temporal bones. Small bilateral subdural hematomas are also noted. Suspect an interhemispheric subdural hematoma anteriorly. The ventricles are in the midline without mass effect or shift. I do not see any findings for downward transtentorial herniation. The gray-white differentiation is maintained. No findings for hemispheric infarction. The brainstem and cerebellum are grossly normal. Vascular: No significant vascular calcifications or hyperdense vessels. Skull: Comminuted and depressed left  posterior parietal/occipital skull fracture also involving the left temporal bone with extension into the anterior mastoid air cells with associated small amount of pneumocephalus. Fluid in the right half of the sphenoid sinus suspicious for basilar skull fracture likely on the right side on image number 15/4. Sinuses/Orbits: Fluid in the right half of the sphenoid sinus. The other paranasal sinuses are clear. Small left-sided mastoid effusions and some fluid in the left middle ear cavity. Other: Fairly extensive left-sided scalp hematoma and wound. CT CERVICAL SPINE FINDINGS Alignment: Normal Skull base and vertebrae: No acute fracture. No primary bone lesion or focal pathologic process. Soft tissues and spinal canal: No prevertebral fluid or swelling. No visible canal hematoma. Disc levels:  No disc protrusions, spinal or foraminal stenosis. Upper chest: The lung apices are grossly clear.  Emphysematous changes are noted. Other: None IMPRESSION: 1. Comminuted and depressed left posterior parietal/occipital/temporal skull fracture with associated adjacent hemorrhagic contusions in the brain. There are also hemorrhagic contusions involving both temporal lobes. 2. Small bilateral subdural hematomas, posterior parietal/occipital. 3. Suspect an interhemispheric subdural hematoma anteriorly. 4. Probable right-sided skull base fracture. 5. Normal alignment of the cervical vertebral bodies and no acute cervical spine fracture. These results were called by telephone at the time of interpretation on 12/25/2020 at 10:23 am to provider Mertha Baars , who verbally acknowledged these results. Electronically Signed   By: Rudie Meyer M.D.   On: 12/25/2020 10:25   DG Chest Port 1 View  Result Date: 12/25/2020 CLINICAL DATA:  Preop. EXAM: PORTABLE CHEST 1 VIEW COMPARISON:  November 20, 2016. FINDINGS: The heart size and mediastinal contours are within normal limits. Both lungs are clear. The visualized skeletal structures are unremarkable. IMPRESSION: No active disease. Electronically Signed   By: Lupita Raider M.D.   On: 12/25/2020 11:57    Review of Systems  Unable to perform ROS: Mental status change   Blood pressure 110/83, pulse (!) 108, temperature 97.9 F (36.6 C), temperature source Oral, resp. rate 17, SpO2 95 %. Physical Exam Constitutional:      Comments: The patient arouses to loud voice and with some difficulty will follow most commands.  He does prefer to be left alone and keeps his eyes closed.  His pupils are 3 mm briskly reactive extraocular movements are full.  The face demonstrates a symmetric grimace.  Tongue and uvula protrude in the midline.  He has good motor strength in the upper extremities but this would be graded only a 4 out of 5 by his maximal effort moves both lower extremities.  HENT:     Head:     Comments: Large boggy area over the left parietal region without any  obvious signs of laceration. Cardiovascular:     Rate and Rhythm: Normal rate and regular rhythm.     Pulses: Normal pulses.  Pulmonary:     Effort: Pulmonary effort is normal.     Breath sounds: Normal breath sounds and air entry.  Abdominal:     General: Bowel sounds are normal.     Palpations: Abdomen is soft.  Musculoskeletal:     Cervical back: Full passive range of motion without pain.  Skin:    General: Skin is warm.     Capillary Refill: Capillary refill takes less than 2 seconds.     Coloration: Skin is pale.     Assessment/Plan Left parieto-occipital depressed skull fracture.  Plan: Craniotomy for elevation of depressed skull fracture.  Stefani Dama, MD 12/25/2020, 12:29 PM

## 2020-12-25 NOTE — ED Provider Notes (Signed)
Depressed skull fx on left side  Multiple hemorrhagic contusion Small bilateral subdurals   Dr. Pecolia Ades   Getting room stat    Cristopher Peru, PA-C 12/25/20 1017    Terald Sleeper, MD 12/25/20 1815
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
## 2020-12-25 NOTE — Anesthesia Procedure Notes (Signed)
Arterial Line Insertion Start/End10/01/2021 12:35 PM, 12/25/2020 12:45 AM Performed by: Rosiland Oz, CRNA, CRNA  Preanesthetic checklist: patient identified, IV checked, site marked, risks and benefits discussed, surgical consent, monitors and equipment checked, pre-op evaluation, timeout performed and anesthesia consent Lidocaine 1% used for infiltration Left, radial was placed Catheter size: 20 G Hand hygiene performed , maximum sterile barriers used  and Seldinger technique used  Attempts: 2 Procedure performed without using ultrasound guided technique. Following insertion, dressing applied and Biopatch. Post procedure assessment: normal and unchanged  Patient tolerated the procedure well with no immediate complications.

## 2020-12-25 NOTE — Anesthesia Procedure Notes (Signed)
Procedure Name: Intubation Date/Time: 12/25/2020 1:13 PM Performed by: Rosiland Oz, CRNA Pre-anesthesia Checklist: Patient identified, Emergency Drugs available, Suction available, Patient being monitored and Timeout performed Patient Re-evaluated:Patient Re-evaluated prior to induction Oxygen Delivery Method: Circle system utilized Preoxygenation: Pre-oxygenation with 100% oxygen Induction Type: IV induction, Rapid sequence and Cricoid Pressure applied Laryngoscope Size: Miller and 3 Grade View: Grade II Tube type: Oral Tube size: 7.5 mm Number of attempts: 1 Airway Equipment and Method: Stylet Placement Confirmation: ETT inserted through vocal cords under direct vision, positive ETCO2 and breath sounds checked- equal and bilateral Secured at: 21 cm Tube secured with: Tape Dental Injury: Dental damage  Difficulty Due To: Difficult Airway- due to dentition Comments: Extremely poor dentition with multiple loose teeth, right incisor was displaced during intubation

## 2020-12-25 NOTE — Transfer of Care (Signed)
Immediate Anesthesia Transfer of Care Note  Patient: John Moody  Procedure(s) Performed: LEFT BIRITAL DECOMPRESS SKULL (Left: Head)  Patient Location: PACU  Anesthesia Type:General  Level of Consciousness: drowsy and patient cooperative  Airway & Oxygen Therapy: Patient Spontanous Breathing  Post-op Assessment: Report given to RN and Post -op Vital signs reviewed and stable  Post vital signs: Reviewed and stable  Last Vitals:  Vitals Value Taken Time  BP 103/68 12/25/20 1550  Temp    Pulse 90 12/25/20 1554  Resp 17 12/25/20 1554  SpO2 100 % 12/25/20 1554  Vitals shown include unvalidated device data.  Last Pain:  Vitals:   12/25/20 1241  TempSrc:   PainSc: 0-No pain         Complications: No notable events documented.
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
## 2020-12-26 ENCOUNTER — Encounter (HOSPITAL_COMMUNITY): Payer: Self-pay | Admitting: Neurological Surgery

## 2020-12-26 MED ORDER — CHLORHEXIDINE GLUCONATE CLOTH 2 % EX PADS
6.0000 | MEDICATED_PAD | Freq: Every day | CUTANEOUS | Status: DC
  Administered 2020-12-26 – 2020-12-31 (×6): 6 via TOPICAL

## 2020-12-26 NOTE — Progress Notes (Signed)
Patient ID: John Moody, male   DOB: 06-18-1975, 45 y.o.   MRN: [id number] Patient appears to be doing well postop day 1 dressing is clean and dry and intact.  His overall condition demonstrates some lethargy.  I believe that he can be transferred out to the floor.  We will obtain a follow-up CT tomorrow.

## 2020-12-26 NOTE — Anesthesia Postprocedure Evaluation (Signed)
Anesthesia Post Note  Patient: John Moody  Procedure(s) Performed: LEFT BIRITAL DECOMPRESS SKULL (Left: Head)     Patient location during evaluation: PACU Anesthesia Type: General Level of consciousness: patient cooperative and awake Pain management: pain level controlled Vital Signs Assessment: post-procedure vital signs reviewed and stable Respiratory status: spontaneous breathing, nonlabored ventilation, respiratory function stable and patient connected to nasal cannula oxygen Cardiovascular status: blood pressure returned to baseline and stable Postop Assessment: no apparent nausea or vomiting Anesthetic complications: no   No notable events documented.  Last Vitals:  Vitals:   12/26/20 0900 12/26/20 1000  BP: 119/72 109/89  Pulse:    Resp: 15 19  Temp:    SpO2:      Last Pain:  Vitals:   12/26/20 0900  TempSrc:   PainSc: 0-No pain                 Bryla Burek

## 2020-12-27 MED ORDER — PANTOPRAZOLE SODIUM 40 MG PO TBEC
40.0000 mg | DELAYED_RELEASE_TABLET | Freq: Every day | ORAL | Status: DC
  Administered 2020-12-27 – 2020-12-30 (×4): 40 mg via ORAL
  Filled 2020-12-27 (×4): qty 1

## 2020-12-27 MED ORDER — SODIUM CHLORIDE 0.9 % IV SOLN
INTRAVENOUS | Status: DC | PRN
  Administered 2020-12-28: 250 mL via INTRAVENOUS

## 2020-12-27 NOTE — Progress Notes (Signed)
Patient ID: John Moody, male   DOB: Mar 13, 1976, 45 y.o.   MRN: [id number] Vital signs are stable Motor function appears good Needs to ambulate and move about Transfer orders have been written yesterday Awaiting bed on the floor Will start PT and OT to evaluate patient

## 2020-12-27 NOTE — TOC CAGE-AID Note (Signed)
Transition of Care [facility]) - CAGE-AID Screening   Patient Details  Name: John Moody MRN: [id number] Date of Birth: [birth year]  [facility] CM/SW Contact:    Ameisha Mcclellan C Tarpley-Carter, LCSWA Phone Number: 12/27/2020, 2:53 PM   Clinical Narrative: Pt participated in Cage-Aid.  Pt admitted to polysubstance use.  Pt was offered resources.  Pt stated they did not feel that they were in need of resources at this time.  CSW will provide resources to pt for possible future use.  Ladavia Lindenbaum Tarpley-Carter, MSW, LCSW-A Pronouns:  She/Her/Hers [facility] HealthTransitions of Care Clinical Social Worker Direct Number:  [phone] [email or web address]  CAGE-AID Screening: Substance Abuse Screening unable to be completed due to: : Patient unable to participate (pt remains confused, but has admitted to alcohol and drug use)  Have You Ever Felt You Ought to Cut Down on Your Drinking or Drug Use?: Yes Have People Annoyed You By Critizing Your Drinking Or Drug Use?: Yes Have You Felt Bad Or Guilty About Your Drinking Or Drug Use?: Yes Have You Ever Had a Drink or Used Drugs First Thing In The Morning to Steady Your Nerves or to Get Rid of a Hangover?: No CAGE-AID Score: 3  Substance Abuse Education Offered: Yes  Substance abuse interventions: Transport planner

## 2020-12-28 NOTE — Progress Notes (Signed)
Inpatient Rehab Admissions Coordinator:   Per therapy recommendations,  patient was screened for CIR candidacy by Megan Salon, MS, CCC-SLP. At this time, Pt. Appears to demonstrate medical necessity, functional decline, and ability to tolerate intensity of CIR. Pt. is a potential candidate for CIR. I will place   order for rehab consult per protocol for full assessment. Note that Pt. Is already very high level and CIR has limited beds through the weekend and next week. Pt. Is likely to progress beyond needing CIR before a bed actually opens up. Please contact me any with questions.   Megan Salon, MS, CCC-SLP Rehab Admissions Coordinator  [phone] (celll) [phone] (office)

## 2020-12-28 NOTE — Progress Notes (Addendum)
Confirmed with patient and family at bedside that he currently resides with his father, Bartosz Luginbill.

## 2020-12-28 NOTE — Progress Notes (Signed)
Patient ID: John Moody, male   DOB: 21-Aug-1975, 45 y.o.   MRN: [id number] Vital signs are stable Motor function appears to be good Patient's family and nursing notes some occasional periods of confusion Speech and language suggest patient may be candidate for CIR.  I will place rehab consult.

## 2020-12-28 NOTE — Evaluation (Signed)
Physical Therapy Evaluation Patient Details Name: John Moody MRN: [id number] DOB: January 26, 1976 Today's Date: 12/28/2020  History of Present Illness  45 y/o male presented to ED on 10/11 for increased confusion and lethargy. CT showed presence of L parietal depressed skill fx with a contusion immediately underneath and evidence of contrcoup contusion in R posterior temporal region and small bilateral SDH. S/p L parietal crani and elevation of depressed skull fx on 10/11. PMH: anemia, stevens-johnson disease  Clinical Impression  Patient reports living with girlfriend and independence prior to admission, however chart states he lives with parents. Patient presents impaired cognition, weakness, impaired balance, and decreased activity tolerance. Patient presents as Ranchos level V, confused and appropriate. Patient ambulating with supervision at slow pace but with mild balance deficits. Will challenge tight spaces and higher level balance next session. Patient will benefit from skilled PT services during acute stay to address listed deficits. Recommend CIR following discharge to maximize functional independence and safety. Recommend SLP consult for cognition.      Recommendations for follow up therapy are one component of a multi-disciplinary discharge planning process, led by the attending physician.  Recommendations may be updated based on patient status, additional functional criteria and insurance authorization.  Follow Up Recommendations CIR    Equipment Recommendations  None recommended by PT    Recommendations for Other Services Rehab consult;Speech consult     Precautions / Restrictions Precautions Precautions: Fall Precaution Comments: head dressing for crani Restrictions Weight Bearing Restrictions: No      Mobility  Bed Mobility Overal bed mobility: Needs Assistance Bed Mobility: Supine to Sit     Supine to sit: Min guard          Transfers Overall transfer level:  Needs assistance Equipment used: 1 person hand held assist Transfers: Sit to/from Stand Sit to Stand: Min assist         General transfer comment: pt initially with posture LOB but then able to sustain static standing  Ambulation/Gait Ambulation/Gait assistance: Min guard;Supervision Gait Distance (Feet): 200 Feet Assistive device: None Gait Pattern/deviations: Step-through pattern;Decreased stride length;Drifts right/left Gait velocity: decreased   General Gait Details: min guard initially with in room ambulation progressing to supervision, however unable to wayfind back to room and unable to recall room number provided to him prior to ambulating in hallway. Slow leisure walk with patient reporting baseline. Will assess ambulation in tight spaces and with obstacles next session  Stairs            Wheelchair Mobility    Modified Rankin (Stroke Patients Only)       Balance Overall balance assessment: Mild deficits observed, not formally tested                                           Pertinent Vitals/Pain Pain Assessment: No/denies pain    Home Living Family/patient expects to be discharged to:: Private residence Living Arrangements: Spouse/significant other Available Help at Discharge: Family Type of Home: Apartment Home Access: Level entry     Home Layout: One level Home Equipment: None Additional Comments: has 3 dogs but unable to name any of them. chart reports lives with parents but pt reports living with girlfriend    Prior Function Level of Independence: Independent         Comments: reports he works but was unable to tell what his job was or the  type of work. pt states "yall ask alot of questions" "i cant think" "i am not sure"     Hand Dominance   Dominant Hand: Right    Extremity/Trunk Assessment   Upper Extremity Assessment Upper Extremity Assessment: Defer to OT evaluation    Lower Extremity Assessment Lower  Extremity Assessment: Overall WFL for tasks assessed    Cervical / Trunk Assessment Cervical / Trunk Assessment: Normal;Other exceptions (s/p L crani with head dressing. unable to visualize wound. dressing dry and intact)  Communication   Communication: No difficulties  Cognition Arousal/Alertness: Awake/alert Behavior During Therapy: WFL for tasks assessed/performed Overall Cognitive Status: Impaired/Different from baseline Area of Impairment: Orientation;Attention;Memory;Following commands;Safety/judgement;Awareness;Rancho level               Rancho Levels of Cognitive Functioning [place] Mirant Scales of Cognitive Functioning: Confused/inappropriate/non-agitated Orientation Level: Disoriented to;Place;Time;Situation Current Attention Level: Sustained Memory: Decreased recall of precautions;Decreased short-term memory Following Commands: Follows one step commands consistently;Follows multi-step commands inconsistently Safety/Judgement: Decreased awareness of safety;Decreased awareness of deficits Awareness: Intellectual   General Comments: pt reports hospital but uncertain which one. reports first memory is hospital. pt unable to verbalize his injury or what happen to his head. pt states "i can't remember anything" . pt reports 1 dog pitbull then later reports 3 dogs but unable to name one dog. pt expressing some awareness to poor recall to therapist      General Comments General comments (skin integrity, edema, etc.): VSS    Exercises     Assessment/Plan    PT Assessment Patient needs continued PT services  PT Problem List Decreased strength;Decreased activity tolerance;Decreased balance;Decreased mobility;Decreased coordination;Decreased cognition;Decreased knowledge of precautions;Decreased safety awareness       PT Treatment Interventions DME instruction;Gait training;Stair training;Functional mobility training;Therapeutic activities;Therapeutic  exercise;Neuromuscular re-education;Balance training;Patient/family education;Cognitive remediation    PT Goals (Current goals can be found in the Care Plan section)  Acute Rehab PT Goals Patient Stated Goal: none stated PT Goal Formulation: With patient Time For Goal Achievement: 01/11/21 Potential to Achieve Goals: Good Additional Goals Additional Goal #1: Patient will score >19 on DGI for reduction in fall risk    Frequency Min 4X/week   Barriers to discharge        Co-evaluation               AM-PAC PT "6 Clicks" Mobility  Outcome Measure Help needed turning from your back to your side while in a flat bed without using bedrails?: A Little Help needed moving from lying on your back to sitting on the side of a flat bed without using bedrails?: A Little Help needed moving to and from a bed to a chair (including a wheelchair)?: A Little Help needed standing up from a chair using your arms (e.g., wheelchair or bedside chair)?: A Little Help needed to walk in hospital room?: A Little Help needed climbing 3-5 steps with a railing? : A Little 6 Click Score: 18    End of Session   Activity Tolerance: Patient tolerated treatment well Patient left: in chair;with call bell/phone within reach;with chair alarm set Nurse Communication: Mobility status PT Visit Diagnosis: Unsteadiness on feet (R26.81);Muscle weakness (generalized) (M62.81)    Time: 7824-2353 PT Time Calculation (min) (ACUTE ONLY): 23 min   Charges:   PT Evaluation $PT Eval Moderate Complexity: 1 Mod PT Treatments $Gait Training: 8-22 mins        Macedonio Scallon A. Dan Humphreys PT, DPT Acute Rehabilitation Services Pager [phone] Office [phone]   Inez Pilgrim A  Dominie Benedick 12/28/2020, 11:36 AM

## 2020-12-28 NOTE — Evaluation (Signed)
Occupational Therapy Evaluation Patient Details Name: John Moody MRN: [id number] DOB: April 24, 1975 Today's Date: 12/28/2020   History of Present Illness 45 y/o male presented to ED on 10/11 for increased confusion and lethargy. CT showed presence of L parietal depressed skill fx with a contusion immediately underneath and evidence of contrcoup contusion in R posterior temporal region and small bilateral SDH. S/p L parietal crani and elevation of depressed skull fx on 10/11. PMH: anemia, stevens-johnson disease   Clinical Impression   Patient is s/p L crani surgery resulting in functional limitations due to the deficits listed below (see OT problem list). Pt demonstrates TBI V confused and appropriate. Pt will progress quickly but cognition is a barrier to safety upon d/c. Pt reports living with girlfriend.  Patient will benefit from skilled OT acutely to increase independence and safety with ADLS to allow discharge CIR. Requesting SLP cognition consult.        Recommendations for follow up therapy are one component of a multi-disciplinary discharge planning process, led by the attending physician.  Recommendations may be updated based on patient status, additional functional criteria and insurance authorization.   Follow Up Recommendations  CIR    Equipment Recommendations  None recommended by OT    Recommendations for Other Services Rehab consult;Speech consult (requesting cognition with SLP)     Precautions / Restrictions Precautions Precautions: Fall Precaution Comments: head dressing for crani      Mobility Bed Mobility Overal bed mobility: Needs Assistance Bed Mobility: Supine to Sit     Supine to sit: Min guard          Transfers Overall transfer level: Needs assistance Equipment used: 1 person hand held assist Transfers: Sit to/from Stand Sit to Stand: Min assist         General transfer comment: pt initially with posture LOB but then able to sustain  static standing    Balance Overall balance assessment: Mild deficits observed, not formally tested                                         ADL either performed or assessed with clinical judgement   ADL Overall ADL's : Needs assistance/impaired Eating/Feeding: Set up   Grooming: Set up;Standing Grooming Details (indicate cue type and reason): leaving water running and states "oh i forgot when cued. Pt asked to toss paper towel in the trash then redirected to water and forgot about paper towel in hand. Upper Body Bathing: Minimal assistance   Lower Body Bathing: Minimal assistance   Upper Body Dressing : Minimal assistance   Lower Body Dressing: Minimal assistance   Toilet Transfer: Minimal assistance   Toileting- Clothing Manipulation and Hygiene: Minimal assistance       Functional mobility during ADLs: Minimal assistance General ADL Comments: poor recall and needs constant redirection.     Vision Baseline Vision/History: 0 No visual deficits Vision Assessment?: Yes Eye Alignment: Within Functional Limits Ocular Range of Motion: Within Functional Limits Alignment/Gaze Preference: Within Defined Limits Tracking/Visual Pursuits: Able to track stimulus in all quads without difficulty Convergence: Within functional limits Additional Comments: L eye with lateral redness     Perception     Praxis      Pertinent Vitals/Pain Pain Assessment: No/denies pain     Hand Dominance Right   Extremity/Trunk Assessment Upper Extremity Assessment Upper Extremity Assessment: Overall WFL for tasks assessed  Cervical / Trunk Assessment Cervical / Trunk Assessment: Normal;Other exceptions (s/p L crani with head dressing. unable to visualize wound. dressing dry and intact)   Communication Communication Communication: No difficulties   Cognition Arousal/Alertness: Awake/alert Behavior During Therapy: WFL for tasks assessed/performed Overall Cognitive  Status: Impaired/Different from baseline Area of Impairment: Orientation;Attention;Memory;Following commands;Safety/judgement;Awareness;Rancho level               Rancho Levels of Cognitive Functioning Rancho Mirant Scales of Cognitive Functioning: Confused/inappropriate/non-agitated Orientation Level: Disoriented to;Place;Time;Situation Current Attention Level: Sustained Memory: Decreased recall of precautions;Decreased short-term memory Following Commands: Follows one step commands consistently;Follows multi-step commands inconsistently Safety/Judgement: Decreased awareness of safety;Decreased awareness of deficits Awareness: Intellectual   General Comments: pt reports hospital but uncertain which one. reports first memory is hospital. pt unable to verbalize his injury or what happen to his head. pt states "i can't remember anything" . pt reports 1 dog pitbull then later reports 3 dogs but unable to name one dog. pt expressing some awareness to poor recall to therapist   General Comments  VSS    Exercises     Shoulder Instructions      Home Living Family/patient expects to be discharged to:: Private residence Living Arrangements: Spouse/significant other Available Help at Discharge: Family Type of Home: Apartment Home Access: Level entry     Home Layout: One level     Bathroom Shower/Tub: Chief Strategy Officer: Standard     Home Equipment: None   Additional Comments: has 3 dogs but unable to name any of them. chart reports lives with parents but pt reports living with girlfriend      Prior Functioning/Environment Level of Independence: Independent        Comments: reports he works but was unable to tell what his job was or the type of work. pt states "yall ask alot of questions" "i cant think" "i am not sure"        OT Problem List: Decreased activity tolerance;Impaired balance (sitting and/or standing);Decreased coordination;Decreased  cognition;Decreased safety awareness;Decreased knowledge of use of DME or AE;Decreased knowledge of precautions      OT Treatment/Interventions: Self-care/ADL training;Energy conservation;DME and/or AE instruction;Manual therapy;Modalities;Cognitive remediation/compensation;Therapeutic activities;Patient/family education;Balance training    OT Goals(Current goals can be found in the care plan section) Acute Rehab OT Goals Patient Stated Goal: none stated OT Goal Formulation: Patient unable to participate in goal setting Time For Goal Achievement: 01/11/21 Potential to Achieve Goals: Good  OT Frequency: Min 2X/week   Barriers to D/C: Other (comment) (unknown)          Co-evaluation              AM-PAC OT "6 Clicks" Daily Activity     Outcome Measure Help from another person eating meals?: A Little Help from another person taking care of personal grooming?: A Little Help from another person toileting, which includes using toliet, bedpan, or urinal?: A Little Help from another person bathing (including washing, rinsing, drying)?: A Little Help from another person to put on and taking off regular upper body clothing?: A Little Help from another person to put on and taking off regular lower body clothing?: A Little 6 Click Score: 18   End of Session Nurse Communication: Mobility status;Precautions  Activity Tolerance: Patient tolerated treatment well Patient left: Other (comment) (with PT Alayna)  OT Visit Diagnosis: Unsteadiness on feet (R26.81);Muscle weakness (generalized) (M62.81)                Time:  2585-2778 OT Time Calculation (min): 17 min Charges:  OT General Charges $OT Visit: 1 Visit OT Evaluation $OT Eval Moderate Complexity: 1 Mod  Brynn, OTR/L  Acute Rehabilitation Services Pager: [phone] Office: [phone] .   Mateo Flow 12/28/2020, 10:21 AM

## 2020-12-29 MED ORDER — LEVETIRACETAM 500 MG PO TABS
500.0000 mg | ORAL_TABLET | Freq: Two times a day (BID) | ORAL | Status: DC
  Administered 2020-12-29 – 2020-12-31 (×4): 500 mg via ORAL
  Filled 2020-12-29 (×4): qty 1

## 2020-12-29 NOTE — Evaluation (Signed)
Speech Language Pathology Evaluation Patient Details Name: John Moody MRN: [id number] DOB: 12-05-1975 Today's Date: 12/29/2020 Time: 6629-4765 SLP Time Calculation (min) (ACUTE ONLY): 17 min  Problem List:  Patient Active Problem List   Diagnosis Date Noted   Depressed skull fracture (HCC) 12/25/2020   Cocaine use disorder, severe, dependence (HCC) 11/24/2016   Alcohol use disorder, severe, dependence (HCC) 11/24/2016   Iron deficiency anemia 11/24/2016   MDD (major depressive disorder), recurrent, severe, with psychosis (HCC) 11/22/2016   Past Medical History:  Past Medical History:  Diagnosis Date   Anemia    Stevens-Johnson disease (HCC)    Past Surgical History:  Past Surgical History:  Procedure Laterality Date   CRANIOTOMY Left 12/25/2020   Procedure: LEFT BIRITAL DECOMPRESS SKULL;  Surgeon: Barnett Abu, MD;  Location: [facility] OR;  Service: Neurosurgery;  Laterality: Left;   HPI:  45 y/o male presented to ED on 10/11 for increased confusion and lethargy. CT showed presence of L parietal depressed skill fx with a contusion immediately underneath and evidence of contrcoup contusion in R posterior temporal region and small bilateral SDH. S/p L parietal crani and elevation of depressed skull fx on 10/11. PMH: anemia, stevens-johnson disease   Assessment / Plan / Recommendation Clinical Impression  Patient presents with cognitive deficits consistent with a Rancho Level VI (confused, appropriate) today with short term memory, attention, awareness, and problem solving impairments. Patient will benefit from SLP f/u in CIR prior to return home.    SLP Assessment  SLP Recommendation/Assessment: Patient needs continued Speech Lanaguage Pathology Services SLP Visit Diagnosis: Cognitive communication deficit (R41.841)    Recommendations for follow up therapy are one component of a multi-disciplinary discharge planning process, led by the attending physician.  Recommendations may be  updated based on patient status, additional functional criteria and insurance authorization.    Follow Up Recommendations  Inpatient Rehab    Frequency and Duration min 2x/week  2 weeks      SLP Evaluation Cognition  Overall Cognitive Status: Impaired/Different from baseline (suspect) Arousal/Alertness: Awake/alert Orientation Level: Oriented to person;Oriented to place;Oriented to situation;Disoriented to time (disoriented to age) Attention: Sustained Sustained Attention: Impaired Sustained Attention Impairment: Verbal basic Memory: Impaired Memory Impairment: Storage deficit;Retrieval deficit;Decreased recall of new information;Decreased short term memory Awareness: Impaired Awareness Impairment: Anticipatory impairment Problem Solving: Impaired Problem Solving Impairment: Verbal complex Executive Function: Reasoning;Decision Making Reasoning: Impaired Reasoning Impairment: Verbal complex Decision Making: Impaired Decision Making Impairment: Verbal complex Safety/Judgment: Impaired Rancho Mirant Scales of Cognitive Functioning: Confused/appropriate       Comprehension  Auditory Comprehension Overall Auditory Comprehension: Appears within functional limits for tasks assessed Reading Comprehension Reading Status: Within funtional limits (for basic reading comprehension)    Expression Expression Primary Mode of Expression: Verbal Verbal Expression Overall Verbal Expression: Appears within functional limits for tasks assessed   Oral / Motor  Oral Motor/Sensory Function Overall Oral Motor/Sensory Function: Within functional limits Motor Speech Overall Motor Speech: Appears within functional limits for tasks assessed   GO                   Ferdinand Lango MA, CCC-SLP  [place] [facility] 12/29/2020, 12:31 PM

## 2020-12-29 NOTE — Progress Notes (Signed)
Subjective: The patient is alert and pleasant.  He is in no apparent distress.  Objective: Vital signs in last 24 hours: Temp:  [99 F (37.2 C)-99.8 F (37.7 C)] 99.3 F (37.4 C) (10/15 0800) Pulse Rate:  [49-91] 55 (10/15 0800) Resp:  [15-22] 20 (10/15 0800) BP: (91-121)/(43-96) 105/84 (10/15 0800) SpO2:  [95 %-100 %] 98 % (10/15 0800) Estimated body mass index is 21.29 kg/m as calculated from the following:   Height as of 05/20/18: 5\' 8"  (1.727 m).   Weight as of 05/20/18: 63.5 kg.   Intake/Output from previous day: 10/14 0701 - 10/15 0700 In: 1515.3 [P.O.:1320; I.V.:4.8; IV Piggyback:190.4] Out: 925 [Urine:925] Intake/Output this shift: No intake/output data recorded.  Physical exam the patient is alert and oriented.  He is moving all 4 extremities well.  Lab Results: No results for input(s): WBC, HGB, HCT, PLT in the last 72 hours. BMET No results for input(s): NA, K, CL, CO2, GLUCOSE, BUN, CREATININE, CALCIUM in the last 72 hours.  Studies/Results: No results found.  Assessment/Plan: Hospital day #4: We are awaiting rehab placement.  LOS: 4 days     05-07-1975 12/29/2020, 8:48 AM     Patient ID: 12/31/2020, male   DOB: 1976/01/05, 45 y.o.   MRN: [id number]

## 2020-12-29 NOTE — Progress Notes (Signed)
Inpatient Rehab Admissions Coordinator:   I will request rehab MD to review case for appropriateness of CIR admit considering Pt. Is ambulating near supervision level but demonstrates significant cognitive deficits.  I will update Pt.'s chart tomorrow.   Megan Salon, MS, CCC-SLP Rehab Admissions Coordinator  [phone] (celll) [phone] (office)
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
## 2020-12-30 NOTE — Progress Notes (Signed)
Inpatient Rehab Admissions Coordinator:   Pt. Progressing rapidly with therapies, now supervision level with transfers and 400 ft of gait. Despite cognitive deficits, Pt. Does not appear to demonstrate functional deficits in mobility to necessitate a CIR stay. CIR will sign off at this time. I will notify TOC.  Megan Salon, MS, CCC-SLP Rehab Admissions Coordinator  [phone] (celll) [phone] (office)

## 2020-12-30 NOTE — Progress Notes (Signed)
Subjective: The patient is alert and pleasant.  He is in no apparent distress.  Objective: Vital signs in last 24 hours: Temp:  [98.3 F (36.8 C)-99.5 F (37.5 C)] 99 F (37.2 C) (10/16 0700) Pulse Rate:  [51-87] 56 (10/16 0600) Resp:  [12-28] 17 (10/16 0600) BP: (80-108)/(53-95) 96/95 (10/16 0400) SpO2:  [91 %-100 %] 97 % (10/16 0600) Estimated body mass index is 21.29 kg/m as calculated from the following:   Height as of 05/20/18: 5\' 8"  (1.727 m).   Weight as of 05/20/18: 63.5 kg.   Intake/Output from previous day: 10/15 0701 - 10/16 0700 In: -  Out: 1675 [Urine:1675] Intake/Output this shift: No intake/output data recorded.  Physical exam the patient is alert and pleasant.  He moves all 4 extremities.  He is oriented to person.  Lab Results: No results for input(s): WBC, HGB, HCT, PLT in the last 72 hours. BMET No results for input(s): NA, K, CL, CO2, GLUCOSE, BUN, CREATININE, CALCIUM in the last 72 hours.  Studies/Results: No results found.  Assessment/Plan: Postop day #5: The patient is okay for transfer to the floor.  We are awaiting placement.  LOS: 5 days     11/16 12/30/2020, 8:05 AM     Patient ID: 01/01/2021, male   DOB: 1976-01-31, 45 y.o.   MRN: [id number]

## 2020-12-31 MED ORDER — LEVETIRACETAM 500 MG PO TABS: 500.0000 mg | ORAL_TABLET | Freq: Every day | ORAL | 3 refills | Status: DC

## 2020-12-31 NOTE — Progress Notes (Signed)
Physical Therapy Treatment Patient Details Name: John Moody MRN: [id number] DOB: June 24, 1975 Today's Date: 12/31/2020   History of Present Illness 45 y/o male presented to ED on 10/11 for increased confusion and lethargy. CT showed presence of L parietal depressed skill fx with a contusion immediately underneath and evidence of contrcoup contusion in R posterior temporal region and small bilateral SDH. S/p L parietal crani and elevation of depressed skull fx on 10/11. PMH: anemia, stevens-johnson disease    PT Comments    Pt sleeping on arrival. With encouragement, pt agreeable to participation in therapy. He required supervision bed mobility, supervision transfers, and supervision ambulation 400' without AD. Supervision required due to mildly unsteady gait and decreased safety awareness. D/C rec updated to OPPT.    Recommendations for follow up therapy are one component of a multi-disciplinary discharge planning process, led by the attending physician.  Recommendations may be updated based on patient status, additional functional criteria and insurance authorization.  Follow Up Recommendations  Outpatient PT;Supervision/Assistance - 24 hour     Equipment Recommendations  None recommended by PT    Recommendations for Other Services       Precautions / Restrictions Precautions Precautions: Fall     Mobility  Bed Mobility Overal bed mobility: Needs Assistance Bed Mobility: Supine to Sit;Sit to Supine     Supine to sit: Supervision Sit to supine: Supervision        Transfers Overall transfer level: Needs assistance Equipment used: None Transfers: Sit to/from Stand Sit to Stand: Supervision            Ambulation/Gait Ambulation/Gait assistance: Supervision Gait Distance (Feet): 400 Feet Assistive device: None Gait Pattern/deviations: Step-through pattern;Decreased stride length;Drifts right/left Gait velocity: decreased Gait velocity interpretation: 1.31 -  2.62 ft/sec, indicative of limited community ambulator General Gait Details: supervision for safety   Stairs             Wheelchair Mobility    Modified Rankin (Stroke Patients Only)       Balance Overall balance assessment: Mild deficits observed, not formally tested                                          Cognition Arousal/Alertness: Awake/alert Behavior During Therapy: WFL for tasks assessed/performed Overall Cognitive Status: Impaired/Different from baseline Area of Impairment: Orientation;Attention;Memory;Awareness;Problem solving;Safety/judgement;Following commands               Rancho Levels of Cognitive Functioning Rancho Los Amigos Scales of Cognitive Functioning: Confused/appropriate Orientation Level: Disoriented to;Time Current Attention Level: Selective Memory: Decreased short-term memory Following Commands: Follows one step commands consistently;Follows multi-step commands inconsistently Safety/Judgement: Decreased awareness of safety;Decreased awareness of deficits Awareness: Emergent Problem Solving: Slow processing        Exercises      General Comments General comments (skin integrity, edema, etc.): VSS      Pertinent Vitals/Pain Pain Assessment: No/denies pain    Home Living                      Prior Function            PT Goals (current goals can now be found in the care plan section) Acute Rehab PT Goals Patient Stated Goal: home Progress towards PT goals: Progressing toward goals    Frequency    Min 4X/week      PT Plan Discharge plan needs to  be updated    Co-evaluation              AM-PAC PT "6 Clicks" Mobility   Outcome Measure  Help needed turning from your back to your side while in a flat bed without using bedrails?: None Help needed moving from lying on your back to sitting on the side of a flat bed without using bedrails?: A Little Help needed moving to and from a bed  to a chair (including a wheelchair)?: A Little Help needed standing up from a chair using your arms (e.g., wheelchair or bedside chair)?: A Little Help needed to walk in hospital room?: A Little Help needed climbing 3-5 steps with a railing? : A Little 6 Click Score: 19    End of Session Equipment Utilized During Treatment: Gait belt Activity Tolerance: Patient tolerated treatment well Patient left: in bed;with call bell/phone within reach;with bed alarm set Nurse Communication: Mobility status PT Visit Diagnosis: Unsteadiness on feet (R26.81);Muscle weakness (generalized) (M62.81)     Time: 3546-5681 PT Time Calculation (min) (ACUTE ONLY): 13 min  Charges:  $Gait Training: 8-22 mins                     Aida Raider, PT  Office # [phone] Pager [phone]    Ilda Foil 12/31/2020, 9:51 AM

## 2020-12-31 NOTE — Progress Notes (Signed)
Staples removed form the left side of the head per Dr Tonia Brooms VO. Patient tolerated well. Will continue to monitor.

## 2020-12-31 NOTE — Discharge Summary (Signed)
Physician Discharge Summary  Patient ID: John Moody MRN: [id number] DOB/AGE: 1975/10/02 45 y.o.  Admit date: 12/25/2020 Discharge date: 12/31/2020  Admission Diagnoses: Depressed skull fracture left parietal region  Discharge Diagnoses: Depressed skull fracture left parietal region measuring 5 cm in diameter and 2-1/2 cm deep with cerebral cortical laceration Active Problems:   Depressed skull fracture [facility])   Discharged Condition: good  Hospital Course: Patient was admitted to undergo elevation of a substantially depressed skull fracture.  He tolerated surgery well.  He has had some modest speech deficits and some confusional episodes but this appears to have cleared.  He is ready for discharge home  Consults: rehabilitation medicine  Significant Diagnostic Studies: None  Treatments: surgery: See op note  Discharge Exam: Blood pressure 102/75, pulse 77, temperature 98.1 F (36.7 C), temperature source Oral, resp. rate 14, SpO2 98 %. Incision on scalp is clean and dry second separate laceration that was debrided but was noted to have been open for what appears to be greater than 24 hours was left to heal by secondary intention.  This is on the vertex of the scalp and measures less than 2 cm in length.  Station and gait and speech are intact.  Disposition: Discharge disposition: 01-Home or Self Care       Discharge Instructions     Call MD for:  redness, tenderness, or signs of infection (pain, swelling, redness, odor or green/yellow discharge around incision site)   Complete by: As directed    Call MD for:  severe uncontrolled pain   Complete by: As directed    Call MD for:  temperature >100.4   Complete by: As directed    Diet - low sodium heart healthy   Complete by: As directed    Discharge wound care:   Complete by: As directed    Patient may shower with soap or shampoo.  Do not apply salves or ointments to incision.   Increase activity slowly   Complete by:  As directed       Allergies as of 12/31/2020   No Known Allergies      Medication List     TAKE these medications    levETIRAcetam 500 MG tablet Commonly known as: KEPPRA Take 1 tablet (500 mg total) by mouth daily.               Discharge Care Instructions  (From admission, onward)           Start     Ordered   12/31/20 0000  Discharge wound care:       Comments: Patient may shower with soap or shampoo.  Do not apply salves or ointments to incision.   12/31/20 1356             Signed: Shary Key Ruchy Wildrick 12/31/2020, 1:56 PM

## 2020-12-31 NOTE — Progress Notes (Signed)
Patient ID: John Moody, male   DOB: 1976/01/23, 45 y.o.   MRN: [id number] Vital signs are stable patient is awake and alert Incision is clean and dry and staples are removed Is ready for discharge.  Therapies can be done as an outpatient.  Discussed with patient importance of refraining from alcohol and stimulants and maintaining use of Keppra.  I will see him as an outpatient a month's time

## 2020-12-31 NOTE — TOC Transition Note (Signed)
Transition of Care [facility]) - CM/SW Discharge Note   Patient Details  Name: NYKO GELL MRN: [id number] Date of Birth: 1975-10-01  Transition of [facility]) CM/SW Contact:  Glennon Mac, RN Phone Number: 12/31/2020, 3:26 PM   Clinical Narrative:    45 y/o male presented to ED on 10/11 for increased confusion and lethargy. CT showed presence of L parietal depressed skill fx with a contusion immediately underneath and evidence of contrcoup contusion in R posterior temporal region and small bilateral SDH. S/p L parietal crani and elevation of depressed skull fx on 10/11.  Prior to admission, patient independent and living at home with significant other.  PT now recommending outpatient therapies, and patient desiring to discharge home.  Referrals placed to [facility] for outpatient PT/OT/ST, and post discharge rehab information placed on AVS.  Final next level of care: OP Rehab Barriers to Discharge: Barriers Resolved   Patient Goals and CMS Choice Patient states their goals for this hospitalization and ongoing recovery are:: to go home               Discharge Plan and Services   Discharge Planning Services: CM Consult                                 Social Determinants of Health (SDOH) Interventions     Readmission Risk Interventions No flowsheet data found.  Quintella Baton, RN, BSN  Trauma/Neuro ICU Case Manager [phone]

## 2021-01-09 ENCOUNTER — Encounter: Payer: Self-pay | Admitting: Occupational Therapy

## 2021-01-09 ENCOUNTER — Other Ambulatory Visit: Payer: Self-pay

## 2021-01-09 ENCOUNTER — Ambulatory Visit: Payer: Medicaid Other | Attending: Neurological Surgery

## 2021-01-09 ENCOUNTER — Ambulatory Visit: Payer: Medicaid Other | Admitting: Speech Pathology

## 2021-01-09 ENCOUNTER — Ambulatory Visit: Payer: Medicaid Other | Admitting: Occupational Therapy

## 2021-01-09 ENCOUNTER — Encounter: Payer: Self-pay | Admitting: Speech Pathology

## 2021-01-09 DIAGNOSIS — R2681 Unsteadiness on feet: Secondary | ICD-10-CM | POA: Diagnosis not present

## 2021-01-09 DIAGNOSIS — R41840 Attention and concentration deficit: Secondary | ICD-10-CM | POA: Insufficient documentation

## 2021-01-09 DIAGNOSIS — M6281 Muscle weakness (generalized): Secondary | ICD-10-CM | POA: Diagnosis present

## 2021-01-09 DIAGNOSIS — R41841 Cognitive communication deficit: Secondary | ICD-10-CM | POA: Insufficient documentation

## 2021-01-09 DIAGNOSIS — S0291XA Unspecified fracture of skull, initial encounter for closed fracture: Secondary | ICD-10-CM | POA: Insufficient documentation

## 2021-01-09 DIAGNOSIS — R2689 Other abnormalities of gait and mobility: Secondary | ICD-10-CM | POA: Diagnosis present

## 2021-01-09 NOTE — Therapy (Signed)
[facility] [address] [place], [place], [address] Phone: [phone]   Fax:  [phone]  Physical Therapy Evaluation  Patient Details  Name: John Moody MRN: [id number] Date of Birth: [birth year] Referring Provider (PT): Barnett Abu MD   Encounter Date: 01/09/2021    Past Medical History:  Diagnosis Date   Anemia    Stevens-Johnson disease [facility])     Past Surgical History:  Procedure Laterality Date   CRANIOTOMY Left 12/25/2020   Procedure: LEFT BIRITAL DECOMPRESS SKULL;  Surgeon: Barnett Abu, MD;  Location: [facility] OR;  Service: Neurosurgery;  Laterality: Left;    There were no vitals filed for this visit.    Subjective Assessment - 01/09/21 1615     Subjective Incurred a skull fx and required surgery. Reports no pain and does not feel he is limited in mobility.    Patient is accompained by: Family member    Pertinent History 45 y/o male presented to ED on 10/11 for increased confusion and lethargy. CT showed presence of L parietal depressed skill fx with a contusion immediately underneath and evidence of contrcoup contusion in R posterior temporal region and small bilateral SDH. S/p L parietal crani and elevation of depressed skull fx on 10/11. PMH: anemia, stevens-johnson disease    How long can you sit comfortably? >30 min    How long can you stand comfortably? >30 min    How long can you walk comfortably? >15 min    Patient Stated Goals none stated, denies deficits    Currently in Pain? No/denies                [facility] PT Assessment - 01/09/21 1621       Assessment   Medical Diagnosis skull fracture    Referring Provider (PT) Barnett Abu MD    Onset Date/Surgical Date 12/31/20    Hand Dominance Right    Prior Therapy CIR      Precautions   Precautions Fall      Balance Screen   Has the patient fallen in the past 6 months No    Has the patient had a decrease in activity level because of  a fear of falling?  No    Is the patient reluctant to leave their home because of a fear of falling?  No      Prior Function   Level of Independence Independent    Vocation Full time employment    Gaffer Works for [employer] Appears Intact      Coordination   Gross Motor Movements are Fluid and Coordinated Yes    Heel Shin Test [facility]      Posture/Postural Control   Posture/Postural Control No significant limitations      ROM / Strength   AROM / PROM / Strength Strength      Strength   Overall Strength Comments BLE 4+/5      Transfers   Transfers Sit to Stand;Stand to Sit;Stand Pivot Transfers    Sit to Stand 6: Modified independent (Device/Increase time)    Five time sit to stand comments  11.2s    Stand to Sit 6: Modified independent (Device/Increase time)    Stand Pivot Transfers 6: Modified independent (Device/Increase time)      Ambulation/Gait   Ambulation/Gait Yes    Ambulation/Gait Assistance 6: Modified independent (Device/Increase time)    Ambulation Distance (Feet) 300 Feet    Assistive device  None    Gait Pattern Within Functional Limits    Ambulation Surface Level;Indoor    Gait velocity 1.55m/s    Stairs Yes    Stairs Assistance 6: Modified independent (Device/Increase time)    Stair Management Technique No rails;Alternating pattern    Number of Stairs 4    Height of Stairs 6      High Level Balance   High Level Balance Comments Able to hold all 4 positions on mCTSIB for 30s.      Functional Gait  Assessment   Gait assessed  Yes    Gait Level Surface Walks 20 ft in less than 5.5 sec, no assistive devices, good speed, no evidence for imbalance, normal gait pattern, deviates no more than 6 in outside of the 12 in walkway width.    Change in Gait Speed Able to smoothly change walking speed without loss of balance or gait deviation. Deviate no more than 6 in outside of the 12 in walkway width.    Gait with Horizontal  Head Turns Performs head turns smoothly with no change in gait. Deviates no more than 6 in outside 12 in walkway width    Gait with Vertical Head Turns Performs head turns with no change in gait. Deviates no more than 6 in outside 12 in walkway width.    Gait and Pivot Turn Pivot turns safely in greater than 3 sec and stops with no loss of balance, or pivot turns safely within 3 sec and stops with mild imbalance, requires small steps to catch balance.    Step Over Obstacle Is able to step over 2 stacked shoe boxes taped together (9 in total height) without changing gait speed. No evidence of imbalance.    Gait with Narrow Base of Support Is able to ambulate for 10 steps heel to toe with no staggering.    Gait with Eyes Closed Walks 20 ft, no assistive devices, good speed, no evidence of imbalance, normal gait pattern, deviates no more than 6 in outside 12 in walkway width. Ambulates 20 ft in less than 7 sec.    Ambulating Backwards Walks 20 ft, no assistive devices, good speed, no evidence for imbalance, normal gait    Steps Alternating feet, no rail.    Total Score 29                        Objective measurements completed on examination: See above findings.                PT Education - 01/09/21 1618     Education Details Discussed eval findings, rehab potential and POC    Person(s) Educated Patient;Parent(s)    Methods Explanation    Comprehension Verbalized understanding                         Plan - 01/09/21 1647     Clinical Impression Statement Patient referred to OPPT to assess strength and balance following a skull fx and subsequent surgical repair.  He denies pain or strength and balance deficts at this time.  Father is present and agrees with assessment.  Patient scores WNL on 5xSTS, mCTSIB and FGA.  Some difficulty following commands and processing but no safety issues noted.  No skilled Physical Therapy needs identified at this time.   Patient will continue with Speech Therapy services    Personal Factors and Comorbidities Comorbidity 1;Social Background    Comorbidities skull fx  Stability/Clinical Decision Making Stable/Uncomplicated    Clinical Decision Making Low    Rehab Potential Good    PT Frequency 1x / week    PT Next Visit Plan DC to HEP    PT Home Exercise Plan n/a             Patient will benefit from skilled therapeutic intervention in order to improve the following deficits and impairments:  Abnormal gait, Difficulty walking, Decreased activity tolerance  Visit Diagnosis: Unsteadiness on feet  Muscle weakness (generalized)  Balance disorder     Problem List Patient Active Problem List   Diagnosis Date Noted   Depressed skull fracture (HCC) 12/25/2020   Cocaine use disorder, severe, dependence (HCC) 11/24/2016   Alcohol use disorder, severe, dependence (HCC) 11/24/2016   Iron deficiency anemia 11/24/2016   MDD (major depressive disorder), recurrent, severe, 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 [place], [place], [address] Phone: [phone]   Fax:  [phone]  Name: John Moody MRN: [id number] Date of Birth: [birth year]
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
## 2021-01-18 ENCOUNTER — Other Ambulatory Visit: Payer: Self-pay

## 2021-01-18 ENCOUNTER — Ambulatory Visit: Payer: Medicaid Other | Attending: Neurological Surgery

## 2021-01-18 DIAGNOSIS — R41841 Cognitive communication deficit: Secondary | ICD-10-CM | POA: Diagnosis not present
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
## 2021-01-18 NOTE — Patient Instructions (Addendum)

## 2021-01-21 ENCOUNTER — Ambulatory Visit: Payer: Medicaid Other

## 2021-01-21 ENCOUNTER — Other Ambulatory Visit: Payer: Self-pay

## 2021-01-21 DIAGNOSIS — R41841 Cognitive communication deficit: Secondary | ICD-10-CM | POA: Diagnosis not present
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
## 2021-01-21 NOTE — Patient Instructions (Addendum)

## 2021-01-23 ENCOUNTER — Encounter: Payer: Self-pay | Admitting: Speech Pathology

## 2021-01-23 ENCOUNTER — Other Ambulatory Visit: Payer: Self-pay

## 2021-01-23 ENCOUNTER — Ambulatory Visit: Payer: Medicaid Other | Admitting: Speech Pathology

## 2021-01-23 DIAGNOSIS — R41841 Cognitive communication deficit: Secondary | ICD-10-CM

## 2021-01-23 NOTE — Therapy (Signed)
[facility] [address] [place], [place], [address] Phone: [phone]   Fax:  [phone]  Speech Language Pathology Treatment  Patient Details  Name: John Moody MRN: [id number] Date of Birth: Feb 18, 1976 Referring Provider (SLP): Kristeen Miss MD   Encounter Date: 01/23/2021   End of Session - 01/23/21 1732     Visit Number 4    Number of Visits 17    Date for SLP Re-Evaluation 03/12/21    Authorization Type medicaid    Authorization - Visit Number 2    Authorization - Number of Visits 12    SLP Start Time 1400    SLP Stop Time  1440    SLP Time Calculation (min) 40 min    Activity Tolerance Patient tolerated treatment well             Past Medical History:  Diagnosis Date   Anemia    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   Subjective Assessment - 01/23/21 1718     Subjective "trouble with my texts. My mind is all jumbled up."    Currently in Pain? No/denies                   ADULT SLP TREATMENT - 01/23/21 1718       General Information   Behavior/Cognition Alert;Cooperative;Pleasant mood      Treatment Provided   Treatment provided Cognitive-Linquistic      Cognitive-Linquistic Treatment   Treatment focused on Cognition    Skilled Treatment SLP assessed pt writing today based off of previous therapist report. Pt was provided with a verbally-presented sentence, example re:"The man walked down the street" and asked to write the sentence down onto a piece of paper. John Moody exhibited written phonemic paraphasias; however, he was able to self-correct when SLP brought attention to the error and provided a repetition of the sentence.  When John Moody was given two words and asked to generate a sentence, example re: "where/or" pt was able to generate a sentence independently with no noted phonemic paraphasias. SLP suspects the spelling errors may be related to deficits in working memory. Pt has difficulty retaining the information  - whether that is his own thought, or a thought provided to him (sentence read by SLP) - then creates/notices an error, and has difficulty alternating that attention back to his previous thought. Pt reports the thought he has then disappears. He reports this mostly happens while texting friends/family. SLP provided edu on attention skills. Pt reported he believes he could benefit from these strategies and recalled an item off a list given by the previous SLP to help with memory and attention. To cont attention edu (icommunicate). Would benefit from internal memory strategies (repetition) to assist with retaining of information.      Assessment / Recommendations / Plan   Plan Continue with current plan of care      Progression Toward Goals   Progression toward goals Progressing toward goals                SLP Short Term Goals - 01/23/21 1734       SLP SHORT TERM GOAL #1   Title Pt will recall 3 memory strategies to assist  with recall of important information across 4 sessions    Time 4    Period Weeks    Status On-going    Target Date 02/07/21      SLP SHORT TERM GOAL #2   Title Pt will recall 3 attention strategies to assist to increase focus and decrease cognitive fatigue across 4 sessions.    Time 4    Period Weeks    Status On-going    Target Date 02/07/21              SLP Long Term Goals - 01/23/21 1734       SLP LONG TERM GOAL #1   Title Pt will report succesful use of memory strategies to increase recall of important information at the end of 4 weeks.    Time 8    Period Weeks    Status On-going    Target Date 03/07/21      SLP LONG TERM GOAL #2   Title Pt will report  the use of attention strategies to decrease cognitive load and increase focus for better participation in ADLs at the end of 4 weeks.    Time 8    Period Weeks    Status On-going    Target Date 03/07/21              Plan - 01/23/21 1732     Clinical Impression Statement See treatment note. 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 Pt, father, and SLP are all in agreement that pt would benefit from services in order to treat cognitive-communication impairment and assist with learning strategies in order to support a safe return to work.    Speech Therapy Frequency 2x / week    Duration 8 weeks    Treatment/Interventions SLP instruction and feedback;Environmental controls;Cueing hierarchy;Functional tasks;Compensatory strategies;Cognitive reorganization;Internal/external aids;Patient/family education    Potential to Achieve Goals Fair    Consulted and Agree with Plan of Care Patient             Patient will benefit from skilled therapeutic intervention in order to improve the following deficits and impairments:   Cognitive communication deficit    Problem List Patient Active Problem List   Diagnosis Date Noted   Depressed skull fracture (HCC) 12/25/2020   Cocaine use disorder, severe, dependence (HCC) 11/24/2016   Alcohol use disorder, severe, dependence (HCC) 11/24/2016   Iron deficiency anemia 11/24/2016   MDD (major depressive disorder), recurrent, severe, with psychosis (HCC) 11/22/2016    Michelene Gardener [place] MS, [place], CBIS  01/23/2021, 5:35 PM  [facility] [address] [place], [place], [address] Phone: [phone]   Fax:  [phone]   Name: John Moody MRN: [id number] Date of Birth: [birth year]

## 2021-01-30 ENCOUNTER — Ambulatory Visit: Payer: Medicaid Other | Admitting: Speech Pathology

## 2021-01-30 ENCOUNTER — Encounter: Payer: Self-pay | Admitting: Speech Pathology

## 2021-01-30 ENCOUNTER — Other Ambulatory Visit: Payer: Self-pay

## 2021-01-30 DIAGNOSIS — R41841 Cognitive communication deficit: Secondary | ICD-10-CM

## 2021-01-30 NOTE — Therapy (Signed)
[facility] [address] [place], [place], [address] Phone: [phone]   Fax:  [phone]  Speech Language Pathology Treatment  Patient Details  Name: John Moody MRN: [id number] Date of Birth: 04/26/1975 Referring Provider (SLP): Barnett Abu MD   Encounter Date: 01/30/2021   End of Session - 01/30/21 1355     Visit Number 5    Number of Visits 17    Date for SLP Re-Evaluation 03/12/21    Authorization Type medicaid    Authorization - Visit Number 3    Authorization - Number of Visits 12    SLP Start Time 1230    SLP Stop Time  1310    SLP Time Calculation (min) 40 min    Activity Tolerance Patient tolerated treatment well             Past Medical History:  Diagnosis Date   Anemia    Stevens-Johnson disease (HCC)     Past Surgical History:  Procedure Laterality Date   CRANIOTOMY Left 12/25/2020   Procedure: LEFT BIRITAL DECOMPRESS SKULL;  Surgeon: Barnett Abu, MD;  [facility];  Service: Neurosurgery;  Laterality: Left;    There were no vitals filed for this visit.   Subjective Assessment - 01/30/21 1354     Subjective "I can't figure out my disability application."    Currently in Pain? No/denies                   ADULT SLP TREATMENT - 01/30/21 1500       General Information   Behavior/Cognition Alert;Cooperative;Pleasant mood      Treatment Provided   Treatment provided Cognitive-Linquistic      Cognitive-Linquistic Treatment   Treatment focused on Cognition    Skilled Treatment SLP targeting active listening skills and attention by assisting pt with navigating SSI website to apply for disability. Pt feels overwhelmed with task and reports he "doesn't even know where to go or how to work the computer." SLP assisted pt in navigating to website and pt reported, "yes, this looks familiar, but I don't know where to go." SLP found "Checklist for Online Adult Disability  Application" and assisted pt with writing down answers to the questions to prepare for completing application re: employment info, personal info, medical info. Pt had difficulty with spelling words, but when he was prompted to break apart the word, pt was able to form correct letters.He also would lose track of word he was writing; for example, pt was attempting to write "pay check", but instead started writing "pay ph (phone)" and then self-corrected. Pt appears to have difficulty  retaining words, and pt was prompted to ask for repetition if he needs a question asked again. Pt reported he is to complete the list for HEP.      Assessment / Recommendations / Plan   Plan Continue with current plan of care      Progression Toward Goals   Progression toward goals Progressing toward goals                SLP Short Term Goals - 01/23/21 1734       SLP SHORT TERM GOAL #1   Title Pt will recall 3 memory strategies to assist with recall of important information across 4 sessions    Time 4    Period Weeks    Status On-going    Target Date 02/07/21      SLP SHORT TERM GOAL #2  Title Pt will recall 3 attention strategies to assist to increase focus and decrease cognitive fatigue across 4 sessions.    Time 4    Period Weeks    Status On-going    Target Date 02/07/21              SLP Long Term Goals - 01/23/21 1734       SLP LONG TERM GOAL #1   Title Pt will report succesful use of memory strategies to increase recall of important information at the end of 4 weeks.    Time 8    Period Weeks    Status On-going    Target Date 03/07/21      SLP LONG TERM GOAL #2   Title Pt will report the use of attention strategies to decrease cognitive load and increase focus for better participation in ADLs at the end of 4 weeks.    Time 8    Period Weeks    Status On-going    Target Date 03/07/21              Plan - 01/30/21 1356     Clinical Impression Statement See treatment  note. **Pt is concered about completing his application for disability. He reports feeling overwhelmed and not understanding what he was told by the "lawyer" he spoke to with SSI. 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   Pt, father, and SLP are all in agreement that pt would benefit from services in order to treat cognitive-communication impairment and assist with learning strategies in order to support a safe return to work.    Speech Therapy Frequency 2x / week    Duration 8 weeks    Treatment/Interventions SLP instruction and feedback;Environmental controls;Cueing hierarchy;Functional tasks;Compensatory strategies;Cognitive reorganization;Internal/external aids;Patient/family education    Potential to Achieve Goals Fair    Consulted and Agree with Plan of Care Patient             Patient will benefit from skilled therapeutic intervention in order to improve the following deficits and impairments:   Cognitive communication deficit    Problem List Patient Active Problem List   Diagnosis Date Noted   Depressed skull fracture ([place]) 12/25/2020   Cocaine use disorder, severe, dependence ([place]) 11/24/2016   Alcohol use disorder, severe, dependence ([place]) 11/24/2016   Iron deficiency anemia 11/24/2016   MDD (major depressive disorder), recurrent, severe, with psychosis ([place]) 11/22/2016    Rosann Auerbach [place] MS, [place], CBIS  01/30/2021, 3:08 PM  [facility] [address] [place], [place], [address] Phone: [phone]   Fax:  [phone]   Name: John Moody MRN: [id number] Date of Birth: 1975/10/30

## 2021-02-01 ENCOUNTER — Ambulatory Visit: Payer: Medicaid Other

## 2021-02-01 ENCOUNTER — Other Ambulatory Visit: Payer: Self-pay

## 2021-02-01 DIAGNOSIS — R41841 Cognitive communication deficit: Secondary | ICD-10-CM | POA: Diagnosis not present

## 2021-02-01 NOTE — Patient Instructions (Addendum)
When you are talking with someone on the phone,  -Write down their name -Date and Time -What they are telling you -How to contact them   Call [employer] to get proof of work history   Call SSI and ask how do you want me to provide job work history?  It's okay to take breaks as needed!

## 2021-02-01 NOTE — Therapy (Signed)
[facility] [address] [place], [place], [address] Phone: [phone]   Fax:  [phone]  Speech Language Pathology Treatment  Patient Details  Name: XANG BROADEN MRN: [id number] Date of Birth: April 08, 1975 Referring Provider (SLP): Kristeen Miss MD   Encounter Date: 02/01/2021   End of Session - 02/01/21 1439     Visit Number 6    Number of Visits 17    Date for SLP Re-Evaluation 03/12/21    Authorization Type medicaid    Authorization - Visit Number 5    Authorization - Number of Visits 12    SLP Start Time 1400    SLP Stop Time  1440    SLP Time Calculation (min) 40 min    Activity Tolerance Patient tolerated treatment well             Past Medical History:  Diagnosis Date   Anemia    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   Subjective Assessment - 02/01/21 1404     Subjective "I'm trying to work on my disability"    Currently in Pain? No/denies                   ADULT SLP TREATMENT - 02/01/21 1404       General Information   Behavior/Cognition Alert;Cooperative;Pleasant mood      Treatment Provided   Treatment provided Cognitive-Linquistic      Cognitive-Linquistic Treatment   Treatment focused on Cognition    Skilled Treatment Pt requested further assistance with problem solving related to SSI disability form completion. Per patient report, he contacted SSI and was transferred various times and told he needed proof of work history. SLP assisted with functional problem solving to identify best way to locate work history, which pt able to complete with occasional min prompting. SLP generated strategy to write down necessary  components in phone conversation as pt indicated feeling confused and experiencing difficulty recalling. Pt verbalized understanding and agreement. SLP provided additional handout that assisted with work history items required. SLP reiterated taking breaks and breaking down tasks to aid accuracy and completion. Pt expressed appreciation for SLP team assistance.      Assessment / Recommendations / Plan   Plan Continue with current plan of care      Progression Toward Goals   Progression toward goals Progressing toward goals              SLP Education - 02/01/21 1531     Education Details HEP, recommendations for cognitive functioning    Person(s) Educated Patient    Methods Explanation;Demonstration;Handout    Comprehension Verbalized understanding;Returned demonstration;Need further instruction              SLP Short Term Goals - 02/01/21 1534       SLP SHORT TERM GOAL #1   Title Pt will recall 3 memory strategies to assist with recall of important information across 4 sessions    Time 4    Period Weeks    Status On-going    Target Date 02/07/21      SLP SHORT TERM GOAL #2   Title Pt will recall 3 attention strategies to assist to increase focus and decrease cognitive  fatigue across 4 sessions.    Time 4    Period Weeks    Status On-going    Target Date 02/07/21              SLP Long Term Goals - 02/01/21 1534       SLP LONG TERM GOAL #1   Title Pt will report succesful use of memory strategies to increase recall of important information at the end of 4 weeks.    Time 8    Period Weeks    Status On-going    Target Date 03/07/21      SLP LONG TERM GOAL #2   Title Pt will report the use of attention strategies to decrease cognitive load and increase focus for better participation in ADLs at the end of 4 weeks.    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 Pt requested additional assistance and recommendations to complete application for disability. SLP provided cues to aid problem solving and attention as well as recommendations to optimize recall and attention. Pt would benefit from services in order to treat cognitive-communication impairment and assist with learning strategies in order to support a safe return to work.    Speech Therapy Frequency 2x / week    Duration 8 weeks    Treatment/Interventions SLP instruction and feedback;Environmental controls;Cueing hierarchy;Functional tasks;Compensatory strategies;Cognitive reorganization;Internal/external aids;Patient/family education    Potential to Achieve Goals Fair    Consulted and Agree with Plan of Care Patient             Patient will benefit from skilled therapeutic intervention in order to improve the following deficits and impairments:   Cognitive communication deficit    Problem List Patient Active Problem List   Diagnosis Date Noted   Depressed skull fracture (HCC) 12/25/2020   Cocaine use disorder, severe, dependence (HCC) 11/24/2016   Alcohol use disorder, severe, dependence (HCC) 11/24/2016   Iron deficiency anemia 11/24/2016   MDD (major depressive disorder), recurrent, severe, with psychosis (HCC) 11/22/2016    Janann Colonel, MA CCC-SLP 02/01/2021, 3:34 PM  [facility] [address] [place], [place], [address] Phone: [phone]   Fax:  [phone]   Name: KAZ AULD MRN: [id number] Date of Birth: [birth year]

## 2021-02-06 ENCOUNTER — Ambulatory Visit: Payer: Medicaid Other | Admitting: Speech Pathology

## 2021-02-06 ENCOUNTER — Other Ambulatory Visit: Payer: Self-pay

## 2021-02-06 ENCOUNTER — Encounter: Payer: Self-pay | Admitting: Speech Pathology

## 2021-02-06 DIAGNOSIS — R41841 Cognitive communication deficit: Secondary | ICD-10-CM

## 2021-02-06 NOTE — Therapy (Signed)
[facility] [address] [place], [place], [address] Phone: [phone]   Fax:  [phone]  Speech Language Pathology Treatment  Patient Details  Name: John Moody MRN: [id number] Date of Birth: 1975/08/15 Referring Provider (SLP): Kristeen Miss MD   Encounter Date: 02/06/2021   End of Session - 02/06/21 1330     Visit Number 7    Number of Visits 17    Date for SLP Re-Evaluation 03/12/21    Authorization Type medicaid    Authorization - Visit Number 6    Authorization - Number of Visits 12    SLP Start Time 8250    SLP Stop Time  1355    SLP Time Calculation (min) 40 min    Activity Tolerance Patient tolerated treatment well             Past Medical History:  Diagnosis Date   Anemia    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   Subjective Assessment - 02/06/21 1326     Subjective "it helped me to get my words together better."    Currently in Pain? No/denies                   ADULT SLP TREATMENT - 02/06/21 1336       General Information   Behavior/Cognition Alert;Cooperative;Pleasant mood      Treatment Provided   Treatment provided Cognitive-Linquistic      Cognitive-Linquistic Treatment   Treatment focused on Cognition    Skilled Treatment "I feel like a much better person since all this happened." "You guys be saving relationships!" Pt brought notebook with what looked to be a journal entry. Pt had dated it and written "Monitor my distractions". He reported he has found speech therapy very helpful and has made him more aware of problems he is facing. Discussed active listening skills (clarification, probing questions,  paraphrasing, and summarizing) as well as asking for repetition to aid comprehension. Pt and SLP generated a list of questions he could ask during a conversation if he was having difficulty understanding what was said to him (these were written in his notebook). Pt reports he feels like he is "renewed" and that he attended church on Sunday for the first time in 7 years. He attributes this to speech therapy and the increased self-awareness this has brought.      Assessment / Recommendations / Plan   Plan Continue with current plan of care      Progression Toward Goals   Progression toward goals Progressing toward goals                SLP Short Term Goals - 02/06/21 1334       SLP SHORT TERM GOAL #1   Title Pt will recall 3 memory strategies to assist with recall of important information across 4 sessions    Time 4    Period Weeks    Status Partially Met   Able to recall 2   Target Date 02/07/21      SLP SHORT TERM GOAL #2   Title Pt will recall 3 attention strategies to assist to increase focus and decrease cognitive fatigue across 4 sessions.    Time 4  Period Weeks    Status Partially Met   Able to recall 2   Target Date 02/07/21              SLP Long Term Goals - 02/01/21 1534       SLP LONG TERM GOAL #1   Title Pt will report succesful use of memory strategies to increase recall of important information at the end of 4 weeks.    Time 8    Period Weeks    Status On-going    Target Date 03/07/21      SLP LONG TERM GOAL #2   Title Pt will report the use of attention strategies to decrease cognitive load and increase focus for better participation in ADLs at the end of 4 weeks.    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 SLP provided further clarification on active listening skills and attention to aid recall of verbally provided information. Pt would benefit from services in order to treat cognitive-communication impairment and assist with learning strategies in order to support a safe return to work.    Speech Therapy Frequency 2x / week    Duration 8 weeks    Treatment/Interventions SLP instruction and feedback;Environmental controls;Cueing hierarchy;Functional tasks;Compensatory strategies;Cognitive reorganization;Internal/external aids;Patient/family education    Potential to Achieve Goals Fair    Consulted and Agree with Plan of Care Patient             Patient will benefit from skilled therapeutic intervention in order to improve the following deficits and impairments:   Cognitive communication deficit    Problem List Patient Active Problem List   Diagnosis Date Noted   Depressed skull fracture ([place]) 12/25/2020   Cocaine use disorder, severe, dependence ([place]) 11/24/2016   Alcohol use disorder, severe, dependence ([place]) 11/24/2016   Iron deficiency anemia 11/24/2016   MDD (major depressive disorder), recurrent, severe, with psychosis ([place]) 11/22/2016    John Moody [place], [place] 02/06/2021, 2:29 PM  [facility] [address] [place], [place], [address] Phone: [phone]   Fax:  [phone]   Name: John Moody MRN: [id number] Date of Birth: [birth year]

## 2021-02-11 ENCOUNTER — Ambulatory Visit: Payer: Medicaid Other

## 2021-02-11 ENCOUNTER — Other Ambulatory Visit: Payer: Self-pay

## 2021-02-11 DIAGNOSIS — R41841 Cognitive communication deficit: Secondary | ICD-10-CM

## 2021-02-11 NOTE — Patient Instructions (Addendum)
Write down notes when you are on the phone or in a meeting  Focus on one thing at a time - turn down tv, go in quiet room  It's okay for you to ask people to repeat or say it in a different way to help you understand   Your family can help you get better - they can tell you if you're not making sense or help you if you're having trouble remembering

## 2021-02-11 NOTE — Therapy (Signed)
[facility] [address] [place], [place], [address] Phone: [phone]   Fax:  [phone]  Speech Language Pathology Treatment  Patient Details  Name: John Moody MRN: [id number] Date of Birth: [birth year] Referring Provider (SLP): Kristeen Miss MD   Encounter Date: 02/11/2021   End of Session - 02/11/21 1322     Visit Number 8    Number of Visits 17    Date for SLP Re-Evaluation 03/12/21    Authorization Type medicaid    Authorization - Visit Number 7    Authorization - Number of Visits 12    SLP Start Time 1324    SLP Stop Time  1400    SLP Time Calculation (min) 41 min    Activity Tolerance Patient tolerated treatment well             Past Medical History:  Diagnosis Date   Anemia    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   Subjective Assessment - 02/11/21 1320     Subjective "I'm not sure what they are asking"    Currently in Pain? No/denies                   ADULT SLP TREATMENT - 02/11/21 1320       General Information   Behavior/Cognition Alert;Cooperative;Pleasant mood;Requires cueing      Treatment Provided   Treatment provided Cognitive-Linquistic      Cognitive-Linquistic Treatment   Treatment focused on Cognition    Skilled Treatment Pt brought letter from [facility], with request for SLP A to increase comprehension. SLP verbally highlighted specific details that pt did not attend to including the fact this upcoming appointment was via phone not in person. SLP instructed patient to highlight key details to aid attention and recall with rare min A needed. Pt requested clarification x1, which was appropriate to the situation.  SLP inquired about previous ST session, in which pt looked at notes from last ST session. SLP had patient verbally summarized recommendations with occasional prompting required. SLP cued patient to put important paperwork in one spot to aid recall.      Assessment / Recommendations / Plan   Plan Continue with current plan of care      Progression Toward Goals   Progression toward goals Progressing toward goals              SLP Education - 02/11/21 1349     Education Details HEP, recs    Person(s) Educated Patient    Methods Explanation;Demonstration;Handout;Verbal cues    Comprehension Verbalized understanding;Returned demonstration;Need further instruction              SLP Short Term Goals - 02/11/21 1322       SLP SHORT TERM GOAL #1   Title Pt will recall 3 memory strategies to assist with recall of important information across 4 sessions    Time 4    Period Weeks    Status Partially Met   Able to recall 2   Target Date 02/07/21      SLP SHORT TERM GOAL #2   Title Pt will recall 3 attention strategies to assist to increase focus and decrease cognitive fatigue across 4 sessions.  Time 4    Period Weeks    Status Partially Met   Able to recall 2   Target Date 02/07/21              SLP Long Term Goals - 02/11/21 1327       SLP LONG TERM GOAL #1   Title Pt will report succesful use of memory strategies to increase recall of important information at the end of 4 weeks.    Time 8    Period Weeks    Status On-going    Target Date 03/07/21      SLP LONG TERM GOAL #2   Title Pt will report the use of attention strategies to decrease cognitive load and increase focus for better participation in ADLs at the end of 4 weeks.    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 SLP provided further clarification on active listening skills and attention to aid recall of verbal and written information. Pt would continue to benefit from services in order to treat cognitive-communication impairment and assist with learning strategies in order to support a safe return to work.    Speech Therapy Frequency 2x / week    Duration 8 weeks    Treatment/Interventions SLP instruction and feedback;Environmental controls;Cueing hierarchy;Functional tasks;Compensatory strategies;Cognitive reorganization;Internal/external aids;Patient/family education    Potential to Achieve Goals Fair    Consulted and Agree with Plan of Care Patient             Patient will benefit from skilled therapeutic intervention in order to improve the following deficits and impairments:   Cognitive communication deficit    Problem List Patient Active Problem List   Diagnosis Date Noted   Depressed skull fracture ([place]) 12/25/2020   Cocaine use disorder, severe, dependence ([place]) 11/24/2016   Alcohol use disorder, severe, dependence ([place]) 11/24/2016   Iron deficiency anemia 11/24/2016   MDD (major depressive disorder), recurrent, severe, with psychosis ([place]) 11/22/2016    Alinda Deem, [place] 02/11/2021, 2:06 PM  [facility] [address] [place], [place], [address] Phone: [phone]   Fax:  [phone]   Name: John Moody MRN: [id number] Date of Birth: 09-27-1975

## 2021-02-13 ENCOUNTER — Ambulatory Visit: Payer: Medicaid Other | Admitting: Speech Pathology

## 2021-02-20 ENCOUNTER — Ambulatory Visit: Payer: Medicaid Other | Attending: Neurological Surgery

## 2021-02-20 ENCOUNTER — Other Ambulatory Visit: Payer: Self-pay

## 2021-02-20 DIAGNOSIS — R41841 Cognitive communication deficit: Secondary | ICD-10-CM | POA: Diagnosis not present

## 2021-02-20 NOTE — Therapy (Signed)
[facility] [address] [place], [place], [address] Phone: [phone]   Fax:  [phone]  Speech Language Pathology Treatment  Patient Details  Name: John Moody MRN: [id number] Date of Birth: 1975-11-13 Referring Provider (SLP): Kristeen Miss MD   Encounter Date: 02/20/2021   End of Session - 02/20/21 1326     Visit Number 9    Number of Visits 17    Date for SLP Re-Evaluation 03/12/21    Authorization Type medicaid    Authorization - Visit Number 8    Authorization - Number of Visits 12    SLP Start Time 1320    SLP Stop Time  1400    SLP Time Calculation (min) 40 min             Past Medical History:  Diagnosis Date   Anemia    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   Subjective Assessment - 02/20/21 1321     Subjective "I got four teeth pulled"    Currently in Pain? Yes    Pain Score 2     Pain Location Mouth                   ADULT SLP TREATMENT - 02/20/21 1334       General Information   Behavior/Cognition Alert;Cooperative;Pleasant mood;Requires cueing      Treatment Provided   Treatment provided Cognitive-Linquistic      Cognitive-Linquistic Treatment   Treatment focused on Cognition    Skilled Treatment Pt indicated improvements in cognitive function with use of learned techniques. Pt continues to write notes to aid recall and use attention strategies. Pt is reportedly managing appointments and medications without error. Pt rated self as 95% back to baseline. Pt would like to decrease 1x/week for remainder of ST sessions due to reported progress. SLP targeted comprehension and attention to detail on structured task, in  which pt initially required SLP A to increase comprehension of mod complex instructions. Pt completed remainder of task with no A. SLP targeted recall of written paragraph, in which pt requested secondary repetition. Pt answered corresponding questions with 80% accuracy.      Assessment / Recommendations / Plan   Plan Continue with current plan of care   decrease to 1x/week     Progression Toward Goals   Progression toward goals Progressing toward goals              SLP Education - 02/20/21 1340     Education Details HEP, recs    Person(s) Educated Patient    Methods Explanation;Demonstration    Comprehension Verbalized understanding;Returned demonstration;Need further instruction              SLP Short Term Goals - 02/11/21 1322       SLP SHORT TERM GOAL #1   Title Pt will recall 3 memory strategies to assist with recall of important information across 4 sessions    Time 4    Period Weeks    Status Partially Met   Able to recall 2   Target Date 02/07/21      SLP SHORT TERM GOAL #2   Title Pt will recall 3 attention strategies to assist to  increase focus and decrease cognitive fatigue across 4 sessions.    Time 4    Period Weeks    Status Partially Met   Able to recall 2   Target Date 02/07/21              SLP Long Term Goals - 02/11/21 1327       SLP LONG TERM GOAL #1   Title Pt will report succesful use of memory strategies to increase recall of important information at the end of 4 weeks.    Time 8    Period Weeks    Status On-going    Target Date 03/07/21      SLP LONG TERM GOAL #2   Title Pt will report the use of attention strategies to decrease cognitive load and increase focus for better participation in ADLs at the end of 4 weeks.    Time 8    Period Weeks    Status On-going    Target Date 03/07/21              Plan - 02/20/21 1327     Clinical Impression Statement John Moody is a 45 yo male who presents to OPST intervention following a  TBI which occurred on 12/25/20 with dx of cogntive communication disorder. SLP provided further clarification on comprehension strategies and attention to aid recall of verbal and written information. Pt reports significant improvements in cognitive linguistic functioning with use of learned techniques and ST intervention. Pt would like to decrease to 1x/week due to proress. Pt would still continue to benefit from services in order to treat cognitive-communication impairment and assist with learning strategies in order to support a safe return to work.    Speech Therapy Frequency 2x / week    Duration 8 weeks    Treatment/Interventions SLP instruction and feedback;Environmental controls;Cueing hierarchy;Functional tasks;Compensatory strategies;Cognitive reorganization;Internal/external aids;Patient/family education    Potential to Achieve Goals Fair    Consulted and Agree with Plan of Care Patient             Patient will benefit from skilled therapeutic intervention in order to improve the following deficits and impairments:   Cognitive communication deficit    Problem List Patient Active Problem List   Diagnosis Date Noted   Depressed skull fracture ([place]) 12/25/2020   Cocaine use disorder, severe, dependence ([place]) 11/24/2016   Alcohol use disorder, severe, dependence ([place]) 11/24/2016   Iron deficiency anemia 11/24/2016   MDD (major depressive disorder), recurrent, severe, with psychosis ([place]) 11/22/2016    Alinda Deem, [place] 02/20/2021, 1:55 PM  [facility] [address] [place], [place], [address] Phone: [phone]   Fax:  [phone]   Name: John Moody MRN: [id number] Date of Birth: 07-09-1975

## 2021-02-25 ENCOUNTER — Ambulatory Visit: Payer: Medicaid Other

## 2021-02-27 ENCOUNTER — Other Ambulatory Visit: Payer: Self-pay

## 2021-02-27 ENCOUNTER — Ambulatory Visit: Payer: Medicaid Other

## 2021-02-27 DIAGNOSIS — R41841 Cognitive communication deficit: Secondary | ICD-10-CM | POA: Diagnosis not present

## 2021-02-27 NOTE — Therapy (Signed)
[facility] [address] [place], [place], [address] Phone: [phone]   Fax:  [phone]  Speech Language Pathology Treatment  Patient Details  Name: John Moody MRN: [id number] Date of Birth: 1975/05/06 Referring Provider (SLP): Kristeen Miss MD   Encounter Date: 02/27/2021   End of Session - 02/27/21 1321     Visit Number 10    Number of Visits 17    Date for SLP Re-Evaluation 03/12/21    Authorization Type medicaid    Authorization - Visit Number 9    Authorization - Number of Visits 12    SLP Start Time 0093    SLP Stop Time  1356    SLP Time Calculation (min) 41 min    Activity Tolerance Patient tolerated treatment well             Past Medical History:  Diagnosis Date   Anemia    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   Subjective Assessment - 02/27/21 1316     Subjective "I've done nothing"    Currently in Pain? No/denies                   ADULT SLP TREATMENT - 02/27/21 1317       General Information   Behavior/Cognition Alert;Cooperative;Pleasant mood;Requires cueing      Treatment Provided   Treatment provided Cognitive-Linquistic      Cognitive-Linquistic Treatment   Treatment focused on Cognition    Skilled Treatment Pt has received positive feedback from family members re: improvements in cognitive communication. SLP targeted functional problem solving to aid completion of daily tasks, in which rare min A needed to identify potential solutions for functional scenarios. Pt reportedly awaiting MD clearance for return to work and drive. 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  Pt dilegently focused on task for 15+ minutes despite presence of background noise. Pt indicated awareness of missed steps. HEP will be to identify missed steps and complete sequencing as well as start re-reading handbook to aid recall and attention for success return to work.      Assessment / Recommendations / Plan   Plan Continue with current plan of care      Progression Toward Goals   Progression toward goals Progressing toward goals              SLP Education - 02/27/21 1329     Education Details work related cognitive tasks    [employer]) Educated Patient    Methods Explanation;Demonstration    Comprehension Verbalized understanding;Returned demonstration;Need further instruction              SLP Short Term Goals - 02/11/21 1322       SLP SHORT TERM GOAL #1   Title Pt will recall 3 memory strategies to assist with recall of important information across 4 sessions    Time 4    Period Weeks    Status Partially Met   Able to recall 2   Target Date 02/07/21      SLP SHORT TERM GOAL #2   Title Pt will recall 3 attention strategies to assist to increase focus and decrease cognitive fatigue  across 4 sessions.    Time 4    Period Weeks    Status Partially Met   Able to recall 2   Target Date 02/07/21              SLP Long Term Goals - 02/11/21 1327       SLP LONG TERM GOAL #1   Title Pt will report succesful use of memory strategies to increase recall of important information at the end of 4 weeks.    Time 8    Period Weeks    Status On-going    Target Date 03/07/21      SLP LONG TERM GOAL #2   Title Pt will report the use of attention strategies to decrease cognitive load and increase focus for better participation in ADLs at the end of 4 weeks.    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 SLP provided further clarification on comprehension strategies and attention to aid recall of verbal and written information. Pt reports significant improvements in cognitive linguistic functioning with use of learned techniques and ST intervention. Pt reports he has nearly returned to baseline with positive feedback reportedly received from family, in which pt may d/c next session. Pt would still continue to benefit from services in order to treat cognitive-communication impairment and assist with learning strategies in order to support a safe return to work.    Speech Therapy Frequency 2x / week    Duration 8 weeks    Treatment/Interventions SLP instruction and feedback;Environmental controls;Cueing hierarchy;Functional tasks;Compensatory strategies;Cognitive reorganization;Internal/external aids;Patient/family education    Potential to Achieve Goals Fair    Consulted and Agree with Plan of Care Patient             Patient will benefit from skilled therapeutic intervention in order to improve the following deficits and impairments:   Cognitive communication deficit    Problem List Patient Active Problem List   Diagnosis Date Noted   Depressed skull fracture ([place]) 12/25/2020   Cocaine use disorder, severe, dependence ([place]) 11/24/2016   Alcohol use disorder, severe, dependence ([place]) 11/24/2016   Iron deficiency anemia 11/24/2016   MDD (major depressive disorder), recurrent, severe, with psychosis ([place]) 11/22/2016    Alinda Deem, [place] 02/27/2021, 1:58 PM  [facility] [address] [place], [place], [address] Phone: [phone]   Fax:  [phone]   Name: John Moody MRN: [id number] Date of Birth: [birth year]

## 2021-02-27 NOTE — Patient Instructions (Addendum)
Continue to write all steps and components on your job. As you see, there are lots of things to remember and pay attention to at work!  We will discuss this next session  You can always re-write if you missed any steps  Look back at your handout to help you remember certain terms and procedures. Take notes to help you remember.

## 2021-03-06 ENCOUNTER — Other Ambulatory Visit: Payer: Self-pay

## 2021-03-06 ENCOUNTER — Ambulatory Visit: Payer: Medicaid Other

## 2021-03-06 DIAGNOSIS — R41841 Cognitive communication deficit: Secondary | ICD-10-CM

## 2021-03-06 NOTE — Therapy (Signed)
[facility] [address] [place], [place], [address] Phone: [phone]   Fax:  [phone]  Speech Language Pathology Treatment/Discharge Summary  Patient Details  Name: John Moody MRN: [id number] Date of Birth: 26-Jul-1975 Referring Provider (SLP): Barnett Abu MD   Encounter Date: 03/06/2021   End of Session - 03/06/21 1328     Visit Number 11    Number of Visits 17    Date for SLP Re-Evaluation 03/12/21    Authorization Type medicaid    Authorization - Visit Number 10    Authorization - Number of Visits 12    SLP Start Time 1320    SLP Stop Time  1350    SLP Time Calculation (min) 30 min    Activity Tolerance Patient tolerated treatment well             Past Medical History:  Diagnosis Date   Anemia    Stevens-Johnson disease ([facility])     Past Surgical History:  Procedure Laterality Date   CRANIOTOMY Left 12/25/2020   Procedure: LEFT BIRITAL DECOMPRESS SKULL;  Surgeon: Barnett Abu, MD;  [facility];  Service: Neurosurgery;  Laterality: Left;    There were no vitals filed for this visit.   Subjective Assessment - 03/06/21 1320     Subjective "I've been Christmas shopping"    Currently in Pain? No/denies             SPEECH THERAPY DISCHARGE SUMMARY  Visits from Start of Care: 11  Current functional level related to goals / functional outcomes: [place] presents with improvements for cognitive linguistic skills of attention and memory. SLP team has educated and trained use of memory/attention strategies to optimize functional independence at home and possible return to work. Pt is pleased with current progress and agreeable to ST discharge this date.    Remaining deficits: Mild cognitive deficits   Education / Equipment: Attention and memory compensations/strategies, functional application, HEP  Patient agrees to discharge. Patient goals were met. Patient is being discharged due  to meeting the stated rehab goals.      ADULT SLP TREATMENT - 03/06/21 1321       General Information   Behavior/Cognition Alert;Cooperative;Pleasant mood     Treatment Provided   Treatment provided Cognitive-Linquistic      Cognitive-Linquistic Treatment   Treatment focused on Cognition    Skilled Treatment Pt returned with HEP completed. Pt re-read materials from job, in which pt able to utilize mental picture to recall completing these tasks at work. Pt simplified information to only include necessary information. Pt recalled 2 upcoming appointments with no A needed. Pt reported he has becoming independent and less reliant on others for cognitive communication tasks. SLP reviewed previously recommended attention and memory compensations. Pt is pleased with current progress and is agreeable to ST discharge as pt met all ST goals. Pt eventually will plan to return to work and independent living, with no concerns reported by patient.      Assessment / Recommendations / Plan   Plan Discharge SLP treatment due to (comment)   ST goals met     Progression Toward Goals   Progression toward goals Goals met, education completed, patient discharged from SLP              SLP Education - 03/06/21 1330     Education Details discharge summary    Person(s) Educated Patient    Methods Explanation;Demonstration    Comprehension Verbalized understanding;Returned demonstration  SLP Short Term Goals - 03/06/21 1329       SLP SHORT TERM GOAL #1   Title Pt will recall 3 memory strategies to assist with recall of important information across 4 sessions    Status Partially Met   Able to recall 2     SLP SHORT TERM GOAL #2   Title Pt will recall 3 attention strategies to assist to increase focus and decrease cognitive fatigue across 4 sessions.    Status Partially Met   Able to recall 2             SLP Long Term Goals - 03/06/21 1329       SLP LONG TERM GOAL #1    Title Pt will report succesful use of memory strategies to increase recall of important information at the end of 4 weeks.    Status Achieved      SLP LONG TERM GOAL #2   Title Pt will report the use of attention strategies to decrease cognitive load and increase focus for better participation in ADLs at the end of 4 weeks.    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 SLP reviewed recommended attention/memory/comprehension strategies to aid daily functioning. Pt reports significant improvements in cognitive linguistic functioning with use of learned techniques and ST intervention. Pt is pleased with current progress and agreeable to ST discharge today as pt has met ST goals.    Speech Therapy Frequency 2x / week    Duration 8 weeks    Treatment/Interventions SLP instruction and feedback;Environmental controls;Cueing hierarchy;Functional tasks;Compensatory strategies;Cognitive reorganization;Internal/external aids;Patient/family education    Potential to Achieve Goals Fair    Consulted and Agree with Plan of Care Patient             Patient will benefit from skilled therapeutic intervention in order to improve the following deficits and impairments:   Cognitive communication deficit    Problem List Patient Active Problem List   Diagnosis Date Noted   Depressed skull fracture ([place]) 12/25/2020   Cocaine use disorder, severe, dependence ([place]) 11/24/2016   Alcohol use disorder, severe, dependence ([place]) 11/24/2016   Iron deficiency anemia 11/24/2016   MDD (major depressive disorder), recurrent, severe, with psychosis ([place]) 11/22/2016    Alinda Deem, [place] 03/06/2021, 1:40 PM  [facility] [address] [place], [place],  [address] Phone: [phone]   Fax:  [phone]   Name: John Moody MRN: [id number] Date of Birth: 10/18/1975

## 2021-03-14 ENCOUNTER — Ambulatory Visit: Payer: Medicaid Other

## 2023-05-12 IMAGING — CT CT HEAD W/O CM
5 of 6 series · 16 of 47 positions shown, 17 images · non-contrast
Comparison: None.

CLINICAL DATA: Confusion. Fell.

EXAM:
CT HEAD WITHOUT CONTRAST
CT CERVICAL SPINE WITHOUT CONTRAST
TECHNIQUE: Multidetector CT imaging of the head and cervical spine was
performed following the standard protocol without intravenous
contrast. Multiplanar CT image reconstructions of the cervical spine
were also generated.

[Series 3: head without · axial · non-contrast · 0.45mm/px · z∈[-47,+3]mm · 2 of 32 slices shown, 3 images]
[im 11/32  brain]
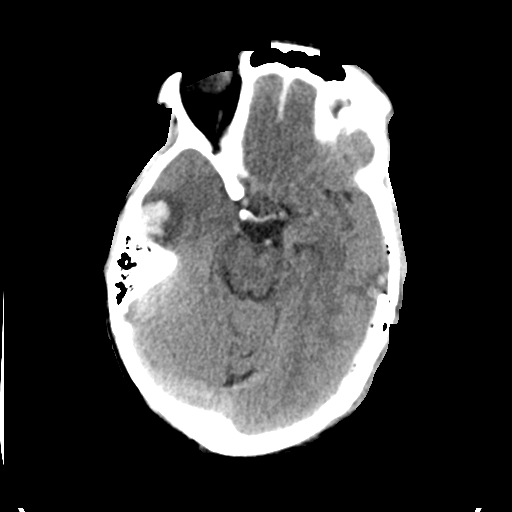
[im 11/32  bone]
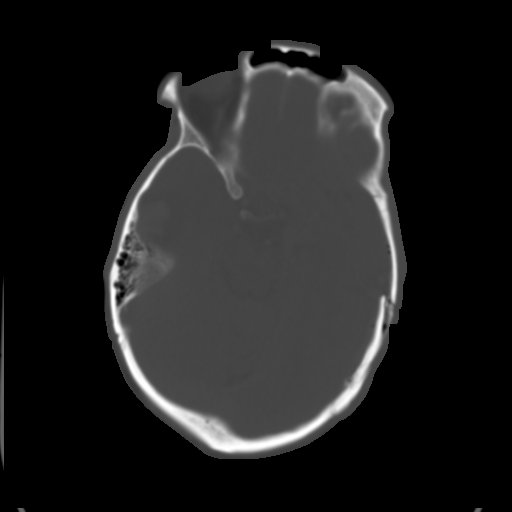
[im 21/32  brain]
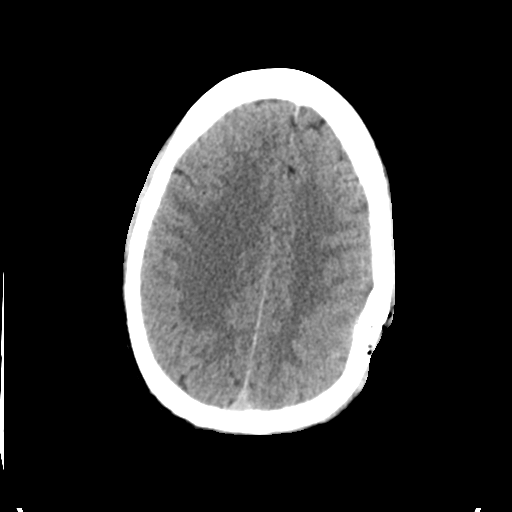

[Series 4: head bone · axial · 0.45mm/px · z∈[-83,+9]mm · 6 of 78 slices shown]
[im 8/78  bone]
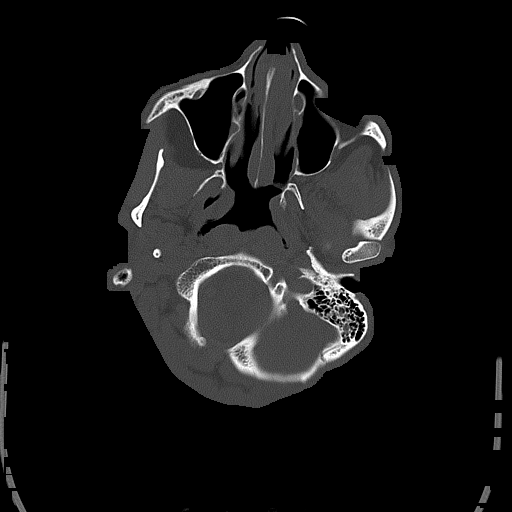
[im 16/78  bone]
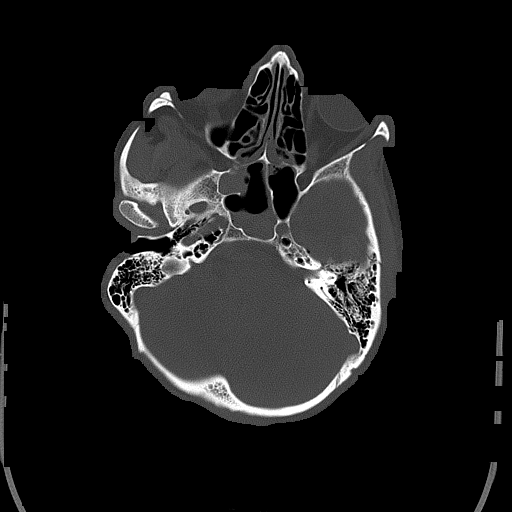
[im 24/78  bone]
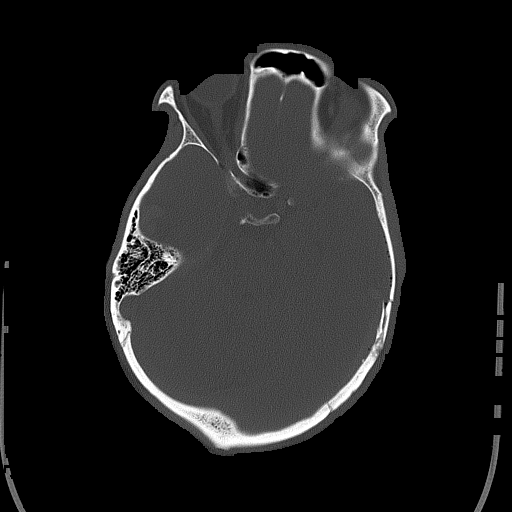
[im 31/78  bone]
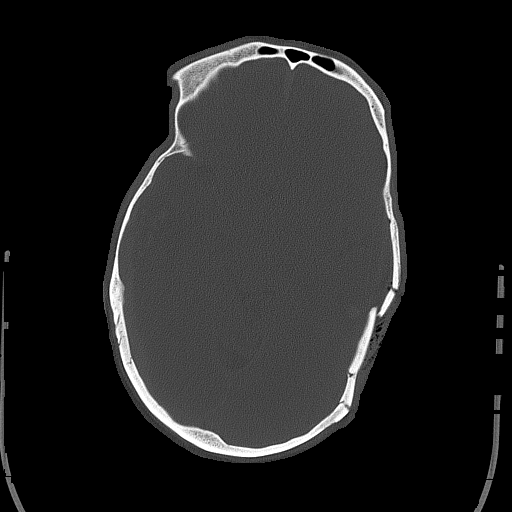
[im 47/78  bone]
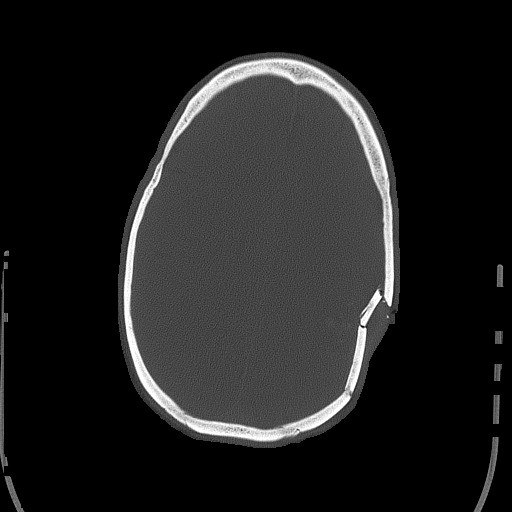
[im 54/78  bone]
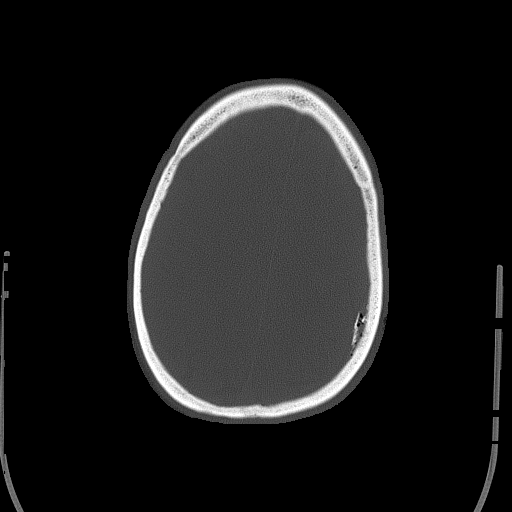

[Series 5: head without cor · coronal · non-contrast · 0.30mm/px · 3 of 67 slices shown]
[im 23/67  brain]
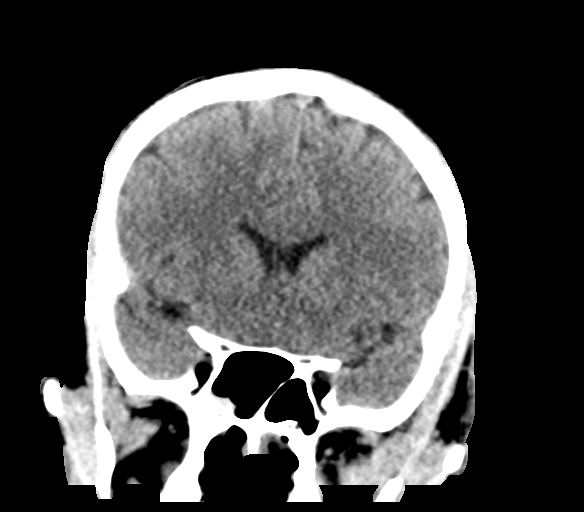
[im 30/67  brain]
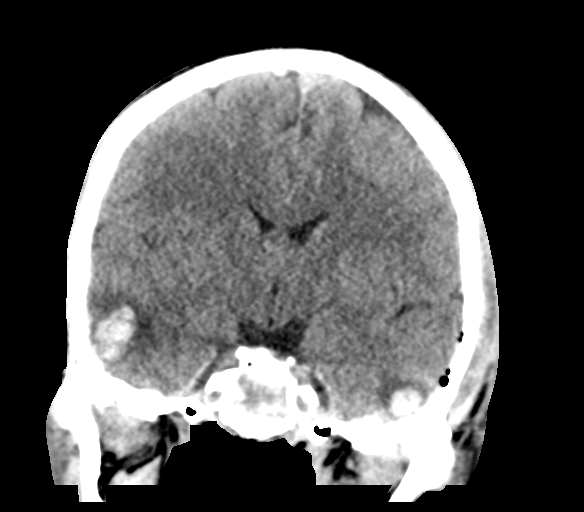
[im 37/67  brain]
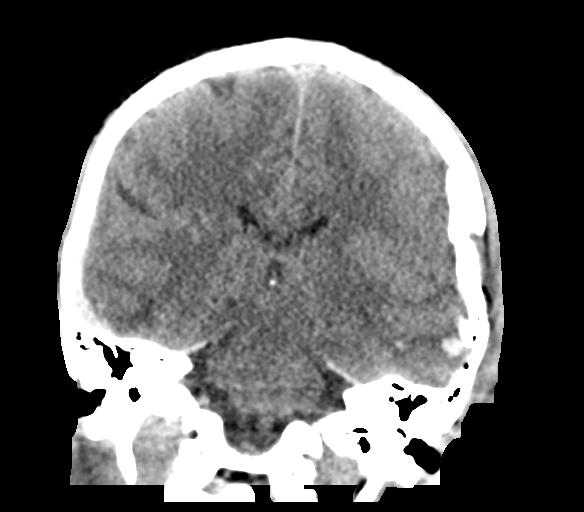

[Series 6: head without sag · sagittal · non-contrast · 0.30mm/px · 3 of 59 slices shown]
[im 20/59  brain]
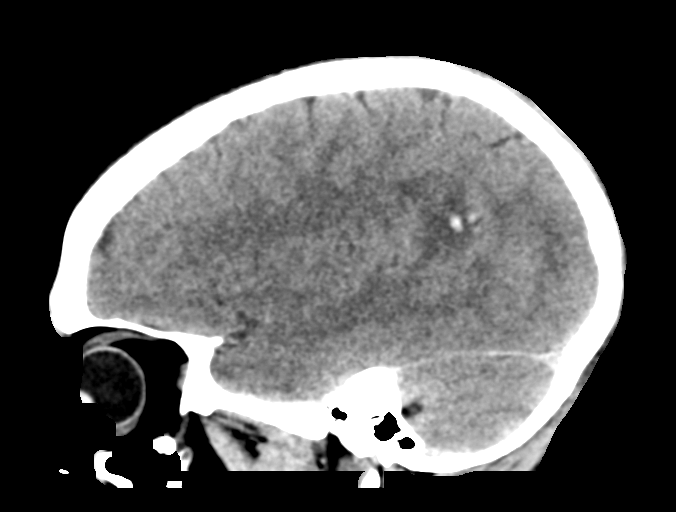
[im 30/59  brain]
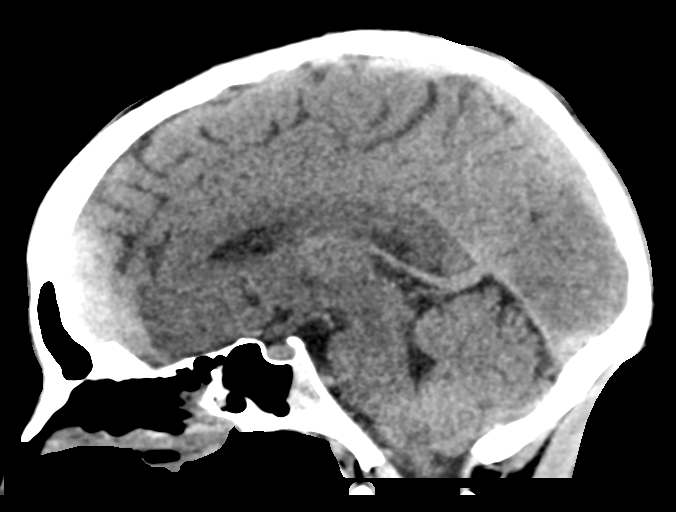
[im 39/59  brain]
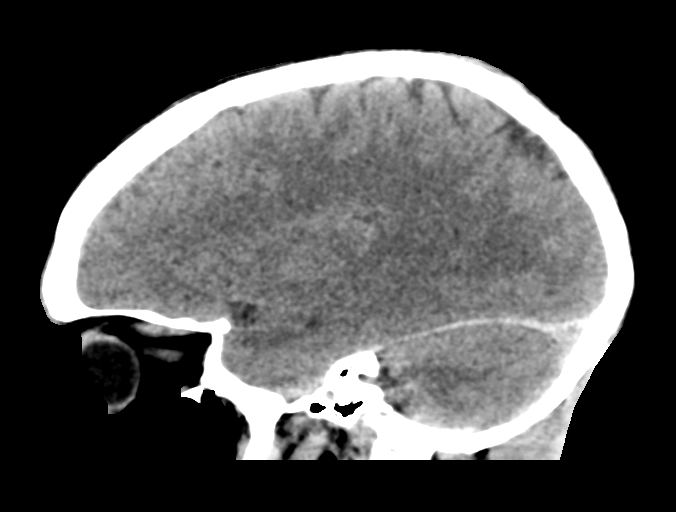

[Series 7: head without ax · axial · non-contrast · 0.45mm/px · z∈[-38,+11]mm · 2 of 31 slices shown]
[im 11/31  brain]
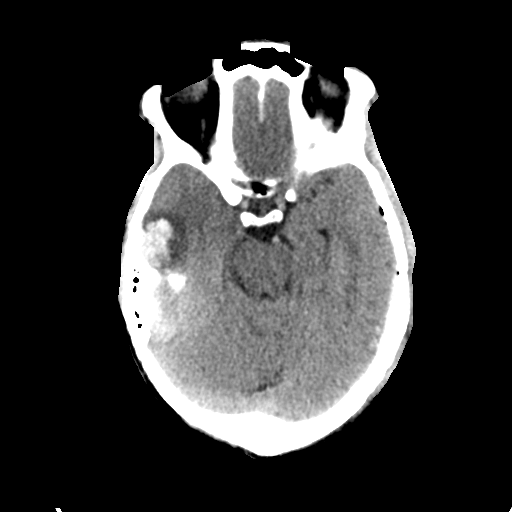
[im 21/31  brain]
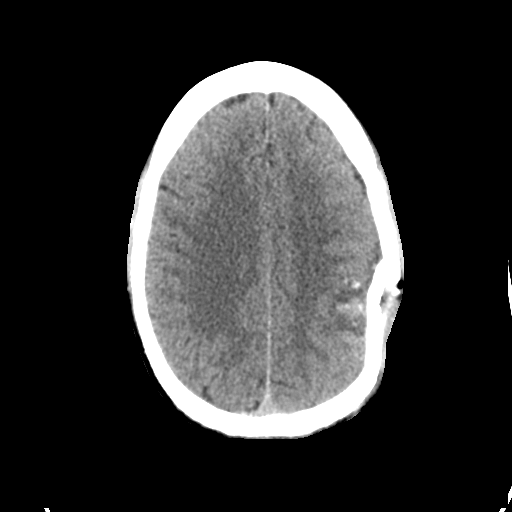

[16 of 47 positions shown; findings below may reference images not displayed]

FINDINGS: CT HEAD FINDINGS

Brain: There is a depressed comminuted left posterior
parietal/occipital skull fracture with associated adjacent
hemorrhagic contusions in the brain. There also hemorrhagic
contusions and both temporal lobes just anterior to the temporal
bones. Small bilateral subdural hematomas are also noted. Suspect an
interhemispheric subdural hematoma anteriorly.

The ventricles are in the midline without mass effect or shift. I do
not see any findings for downward transtentorial herniation. The
gray-white differentiation is maintained. No findings for
hemispheric infarction.

The brainstem and cerebellum are grossly normal.

Vascular: No significant vascular calcifications or hyperdense
vessels.

Skull: Comminuted and depressed left posterior parietal/occipital
skull fracture also involving the left temporal bone with extension
into the anterior mastoid air cells with associated small amount of
pneumocephalus.

Fluid in the right half of the sphenoid sinus suspicious for basilar
skull fracture likely on the right side on image number [DATE].

Sinuses/Orbits: Fluid in the right half of the sphenoid sinus. The
other paranasal sinuses are clear. Small left-sided mastoid
effusions and some fluid in the left middle ear cavity.

Other: Fairly extensive left-sided scalp hematoma and wound.

CT CERVICAL SPINE FINDINGS

Alignment: Normal

Skull base and vertebrae: No acute fracture. No primary bone lesion
or focal pathologic process.

Soft tissues and spinal canal: No prevertebral fluid or swelling. No
visible canal hematoma.

Disc levels:  No disc protrusions, spinal or foraminal stenosis.

Upper chest: The lung apices are grossly clear. Emphysematous
changes are noted.

Other: None
IMPRESSION: 1. Comminuted and depressed left posterior
parietal/occipital/temporal skull fracture with associated adjacent
hemorrhagic contusions in the brain. There are also hemorrhagic
contusions involving both temporal lobes.
2. Small bilateral subdural hematomas, posterior parietal/occipital.
3. Suspect an interhemispheric subdural hematoma anteriorly.
4. Probable right-sided skull base fracture.
5. Normal alignment of the cervical vertebral bodies and no acute
cervical spine fracture.

These results were called by telephone at the time of interpretation
on 12/25/2020 at [DATE] to provider CANDIE BAK , who verbally
acknowledged these results.

## 2023-05-12 IMAGING — CT CT CERVICAL SPINE W/O CM
3 of 4 series · 10 of 33 positions shown, 12 images · non-contrast
Comparison: None.

CLINICAL DATA: Confusion. Fell.

EXAM:
CT HEAD WITHOUT CONTRAST
CT CERVICAL SPINE WITHOUT CONTRAST
TECHNIQUE: Multidetector CT imaging of the head and cervical spine was
performed following the standard protocol without intravenous
contrast. Multiplanar CT image reconstructions of the cervical spine
were also generated.

[Series 4: c_spine 2.0 st · axial · 0.35mm/px · z∈[-190,-122]mm · 2 of 104 slices shown, 3 images]
[im 35/104  soft-tissue]
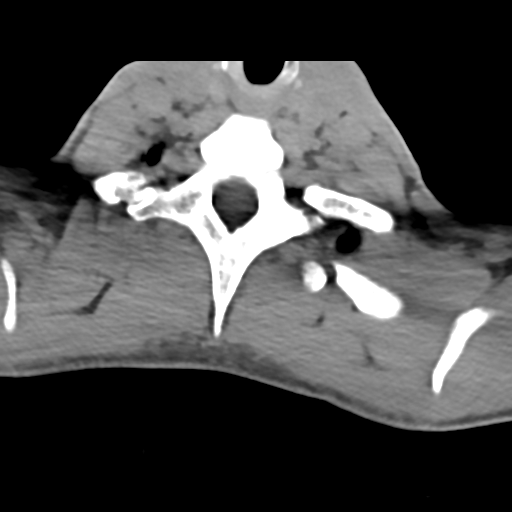
[im 35/104  bone]
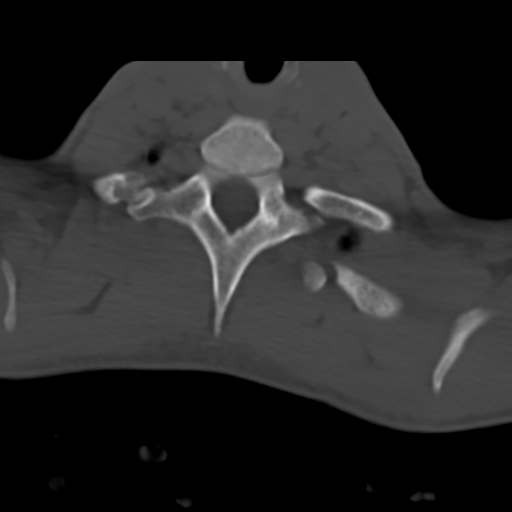
[im 69/104  bone]
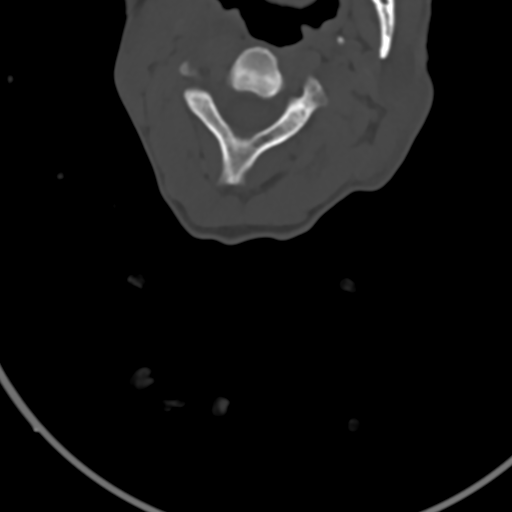

[Series 8: c_spine 2.0 sag bone · sagittal · 0.30mm/px · 5 of 61 slices shown, 6 images]
[im 21/61  bone]
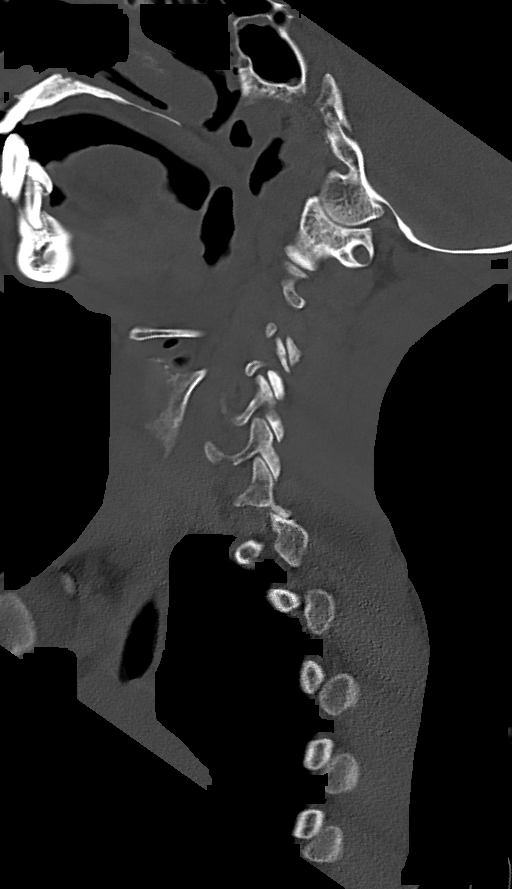
[im 26/61  bone]
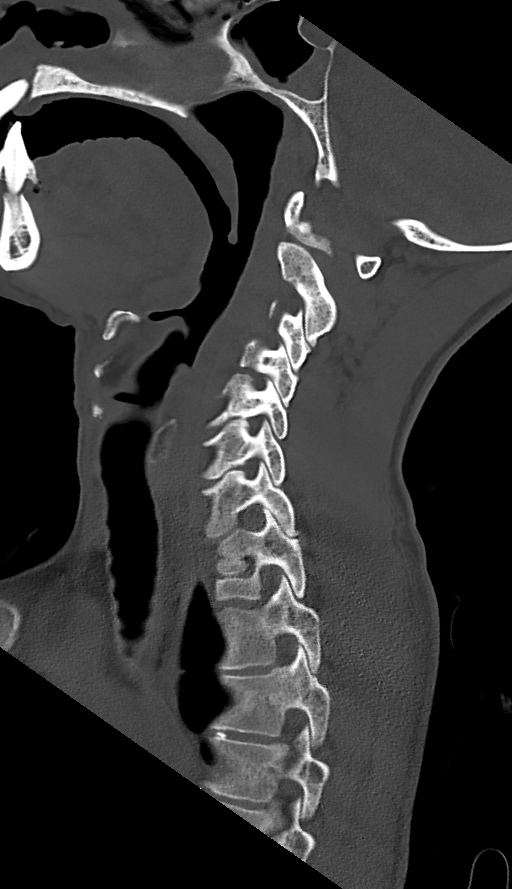
[im 31/61  soft-tissue]
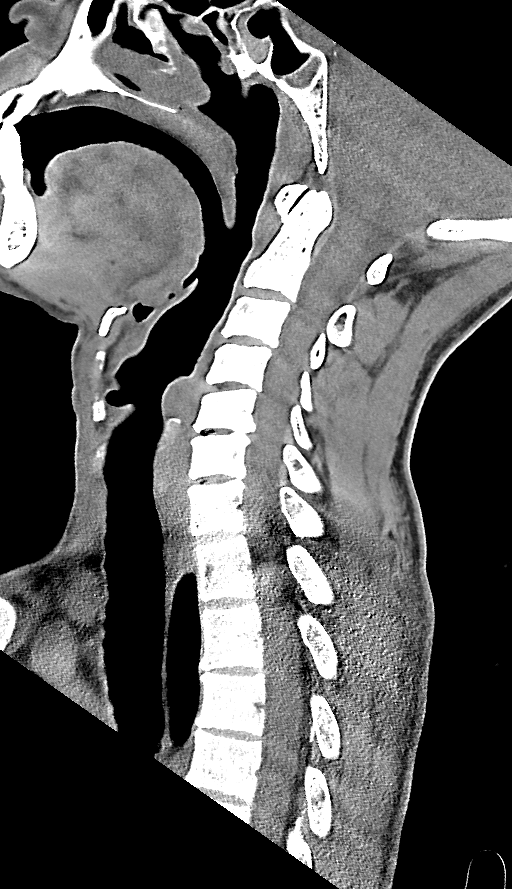
[im 31/61  bone]
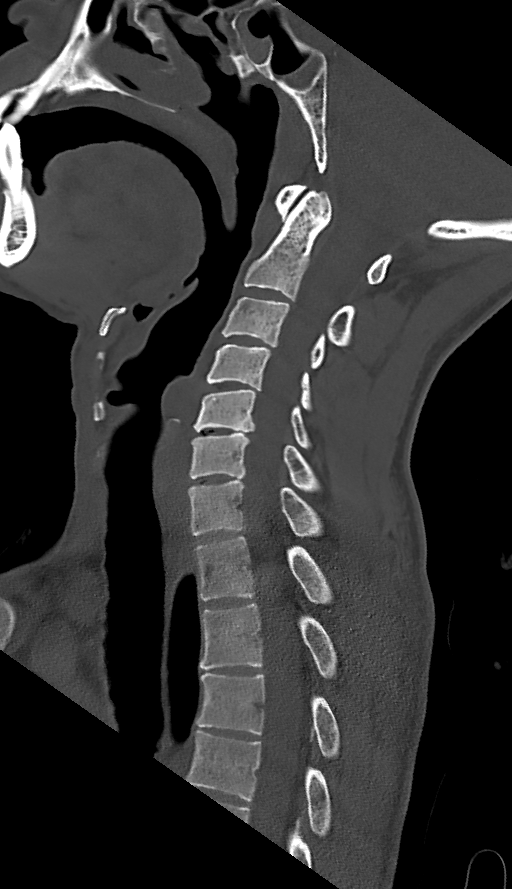
[im 36/61  bone]
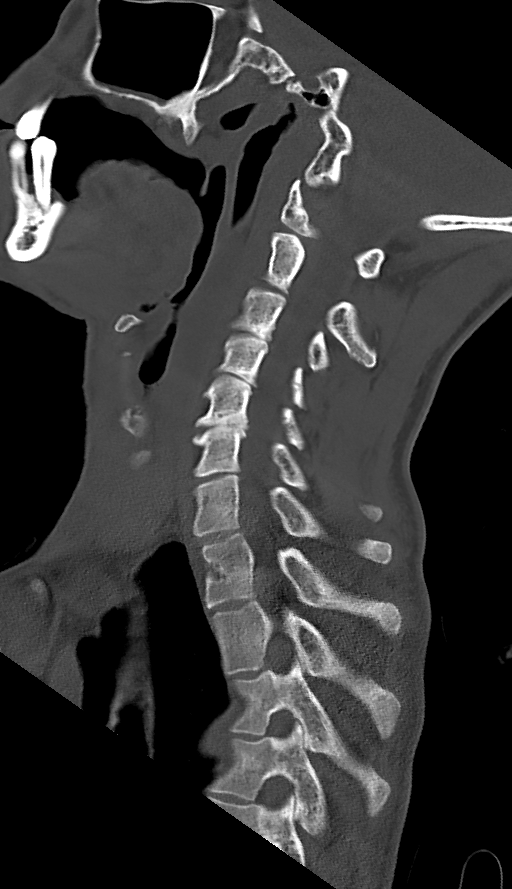
[im 41/61  bone]
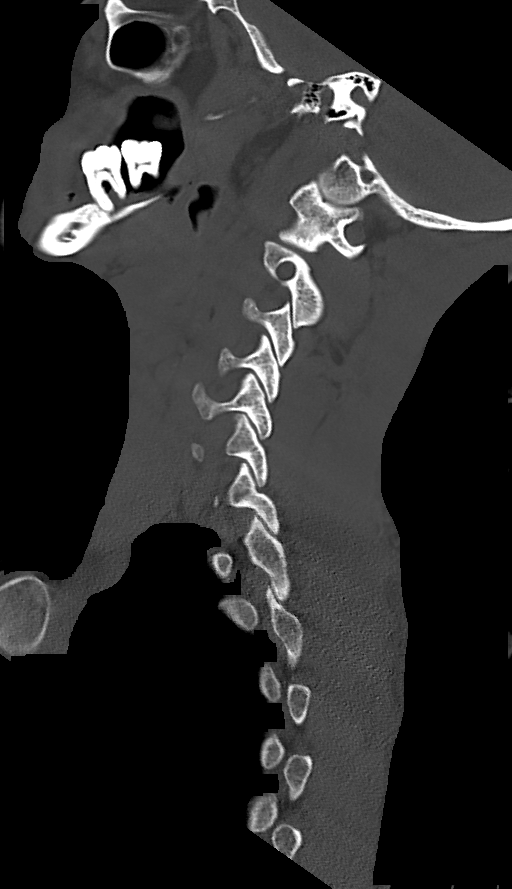

[Series 9: c_spine 2.0 cor bone · coronal · 0.30mm/px · 3 of 61 slices shown]
[im 17/61  bone]
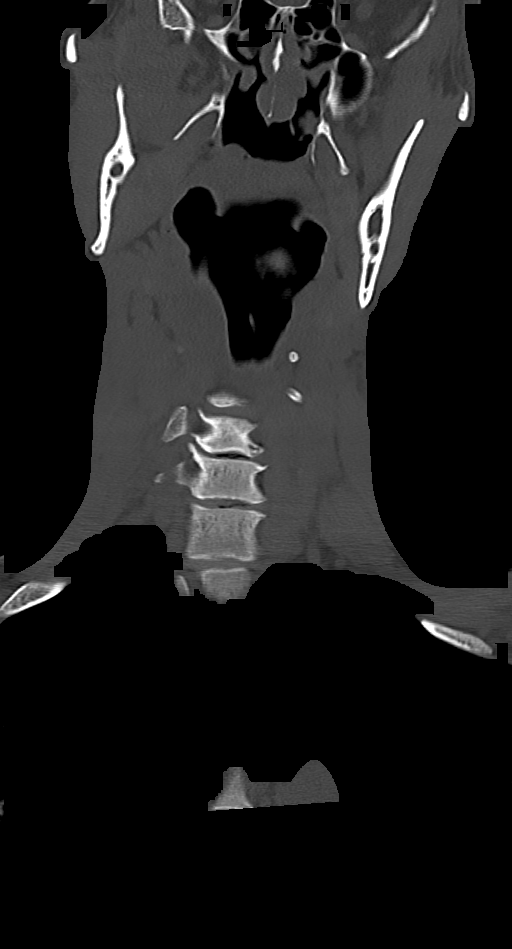
[im 26/61  bone]
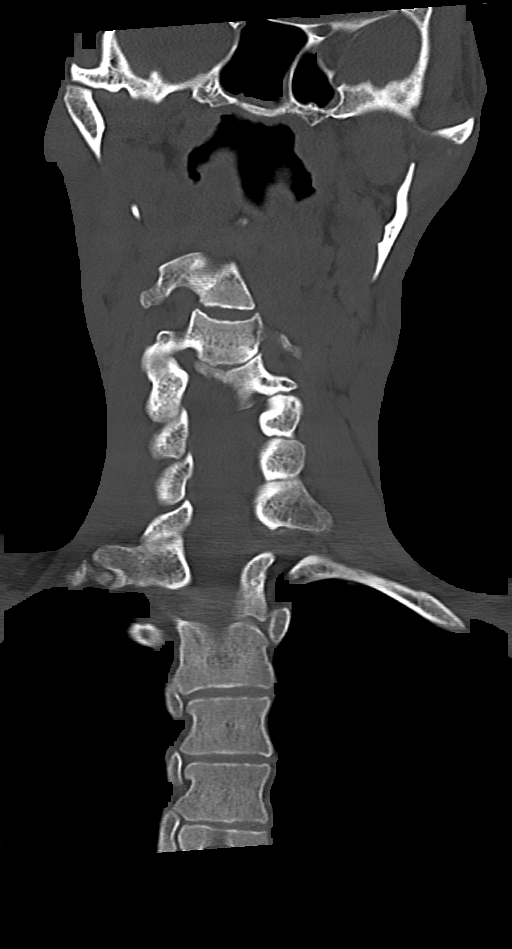
[im 35/61  bone]
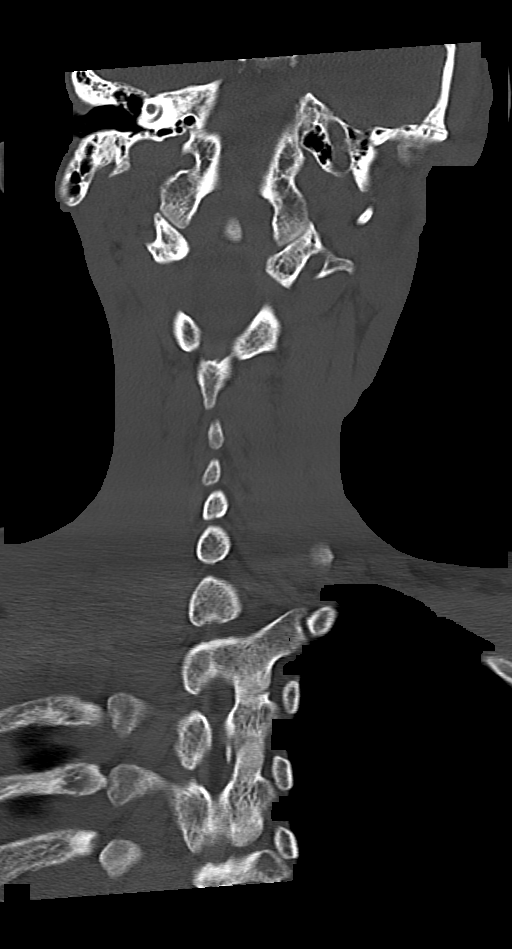

[10 of 33 positions shown; findings below may reference images not displayed]

FINDINGS: CT HEAD FINDINGS

Brain: There is a depressed comminuted left posterior
parietal/occipital skull fracture with associated adjacent
hemorrhagic contusions in the brain. There also hemorrhagic
contusions and both temporal lobes just anterior to the temporal
bones. Small bilateral subdural hematomas are also noted. Suspect an
interhemispheric subdural hematoma anteriorly.

The ventricles are in the midline without mass effect or shift. I do
not see any findings for downward transtentorial herniation. The
gray-white differentiation is maintained. No findings for
hemispheric infarction.

The brainstem and cerebellum are grossly normal.

Vascular: No significant vascular calcifications or hyperdense
vessels.

Skull: Comminuted and depressed left posterior parietal/occipital
skull fracture also involving the left temporal bone with extension
into the anterior mastoid air cells with associated small amount of
pneumocephalus.

Fluid in the right half of the sphenoid sinus suspicious for basilar
skull fracture likely on the right side on image number [DATE].

Sinuses/Orbits: Fluid in the right half of the sphenoid sinus. The
other paranasal sinuses are clear. Small left-sided mastoid
effusions and some fluid in the left middle ear cavity.

Other: Fairly extensive left-sided scalp hematoma and wound.

CT CERVICAL SPINE FINDINGS

Alignment: Normal

Skull base and vertebrae: No acute fracture. No primary bone lesion
or focal pathologic process.

Soft tissues and spinal canal: No prevertebral fluid or swelling. No
visible canal hematoma.

Disc levels:  No disc protrusions, spinal or foraminal stenosis.

Upper chest: The lung apices are grossly clear. Emphysematous
changes are noted.

Other: None
IMPRESSION: 1. Comminuted and depressed left posterior
parietal/occipital/temporal skull fracture with associated adjacent
hemorrhagic contusions in the brain. There are also hemorrhagic
contusions involving both temporal lobes.
2. Small bilateral subdural hematomas, posterior parietal/occipital.
3. Suspect an interhemispheric subdural hematoma anteriorly.
4. Probable right-sided skull base fracture.
5. Normal alignment of the cervical vertebral bodies and no acute
cervical spine fracture.

These results were called by telephone at the time of interpretation
on 12/25/2020 at [DATE] to provider CANDIE BAK , who verbally
acknowledged these results.

## 2023-05-12 IMAGING — CR DG CERVICAL SPINE 2 OR 3 VIEWS
2 series · 2 of 2 positions shown · non-contrast
Comparison: No prior C-spine radiographs, correlation is made with
CT cervical spine 01/25/2021

CLINICAL DATA: Tooth dislodged during intubation

EXAM:
CERVICAL SPINE - 2-3 VIEW

[lateral]
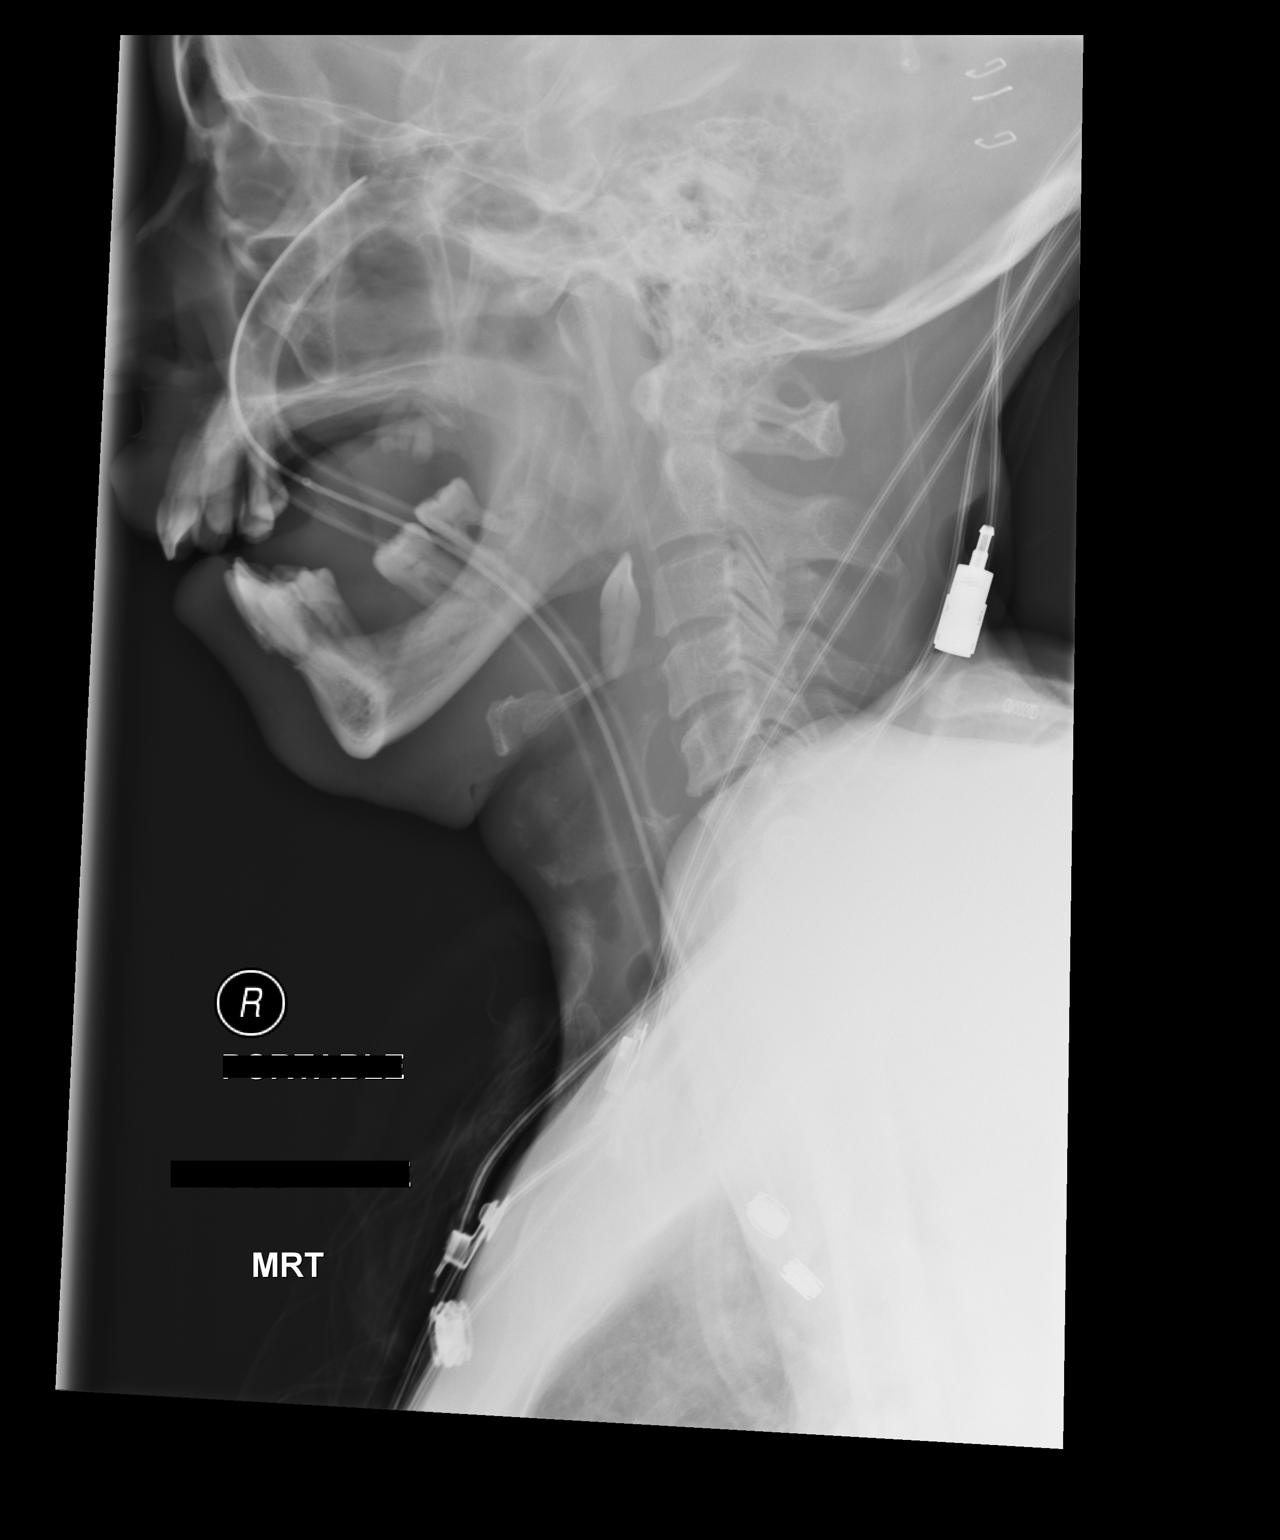

[AP]
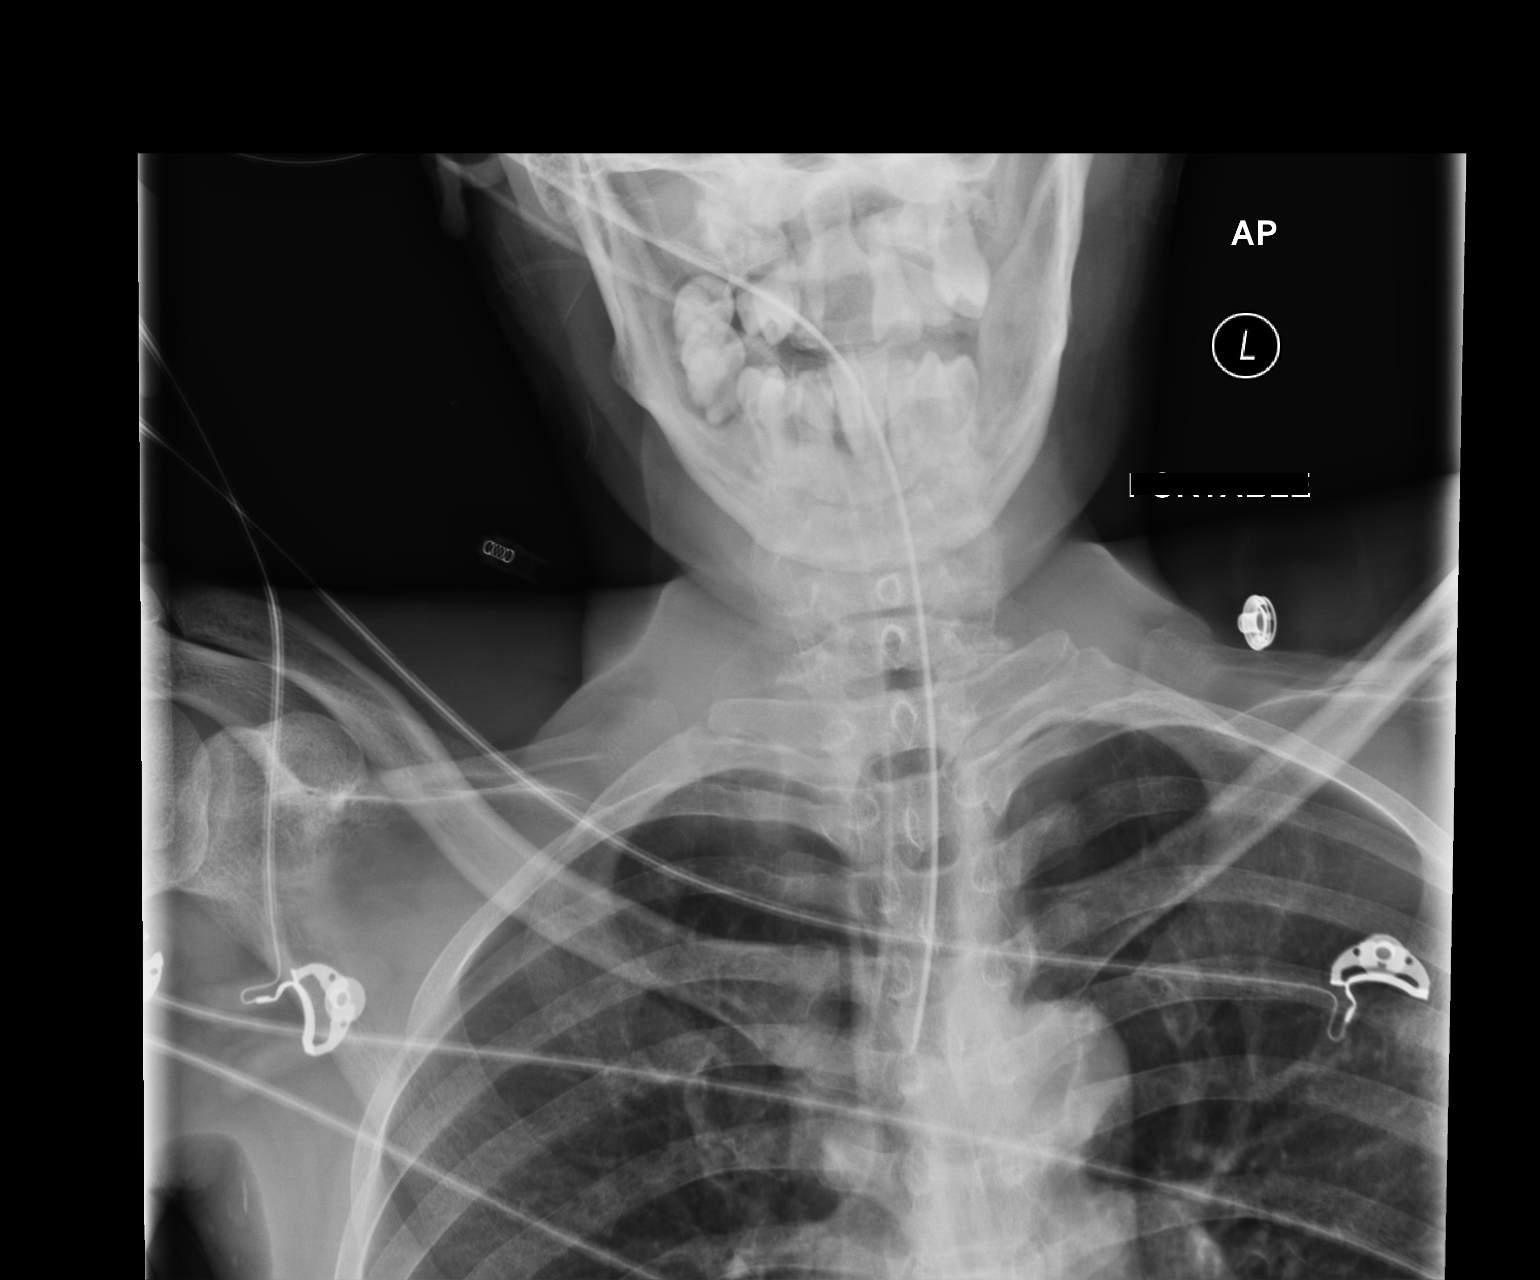

[2 of 2 positions shown; findings below may reference images not displayed]

FINDINGS: Tooth noted overlying the hypopharynx.

The cervical spine is visualized from the skull base through C5,
with limited visualization of C6. There is no evidence of cervical
spine fracture or prevertebral soft tissue swelling. Alignment is
normal.
IMPRESSION: 1. Tooth noted overlying the hypopharynx. Per the technologist's
note, the tooth was retrieved.
2. No acute process in the cervical spine.

.

## 2024-03-21 ENCOUNTER — Encounter (HOSPITAL_COMMUNITY): Payer: Self-pay

## 2024-03-21 ENCOUNTER — Ambulatory Visit (HOSPITAL_COMMUNITY)
Admission: EM | Admit: 2024-03-21 | Discharge: 2024-03-21 | Disposition: A | Discharge status: Home / Self Care | Payer: MEDICAID | Attending: Family Medicine | Admitting: Family Medicine

## 2024-03-21 DIAGNOSIS — J069 Acute upper respiratory infection, unspecified: Secondary | ICD-10-CM

## 2024-03-21 DIAGNOSIS — J111 Influenza due to unidentified influenza virus with other respiratory manifestations: Secondary | ICD-10-CM

## 2024-03-21 LAB — POCT INFLUENZA A/B
Influenza A, POC: POSITIVE — AB
Influenza B, POC: NEGATIVE

## 2024-03-21 LAB — POC SOFIA SARS ANTIGEN FIA: SARS Coronavirus 2 Ag: NEGATIVE

## 2024-03-21 MED ORDER — OSELTAMIVIR PHOSPHATE 75 MG PO CAPS: 75.0000 mg | ORAL_CAPSULE | Freq: Two times a day (BID) | ORAL | 0 refills | Status: AC

## 2024-03-21 MED ORDER — BENZONATATE 100 MG PO CAPS: 100.0000 mg | ORAL_CAPSULE | Freq: Three times a day (TID) | ORAL | 0 refills | Status: AC | PRN

## 2024-03-21 MED ORDER — ONDANSETRON 4 MG PO TBDP: 4.0000 mg | ORAL_TABLET | Freq: Three times a day (TID) | ORAL | 0 refills | Status: AC | PRN

## 2024-03-21 NOTE — ED Triage Notes (Signed)
 Patient here today with c/o little cough, chills, sweats, and loss of appetite since Saturday. Patient has been taking Vicks cold medicine with no relief. No known sick contacts.

## 2024-03-21 NOTE — Discharge Instructions (Addendum)
 Your test for flu was positive COVID test was negative  Take oseltamivir  75 mg--1 capsule 2 times daily for 5 days  Take benzonatate  100 mg, 1 tab every 8 hours as needed for cough.  Ondansetron  dissolved in the mouth every 8 hours as needed for nausea or vomiting. Clear liquids(water, gatorade/pedialyte, ginger ale/sprite, chicken broth/soup) and bland things(crackers/toast, rice, potato, bananas) to eat. Avoid acidic foods like lemon/lime/orange/tomato, and avoid greasy/spicy foods.  Tylenol  would be the easiest on your stomach to take for pain and fever.

## 2024-03-21 NOTE — ED Provider Notes (Signed)
 " [facility]    CSN: [id number] Arrival date & time: 03/21/24  1140      History   Chief Complaint Chief Complaint  Patient presents with   Cough    HPI John Moody is a 49 y.o. male.    Cough  Here for cough and nasal congestion and loss of appetite and nausea.  Symptoms began on January 3.  He has had some subjective fever and some sweats and chills.  NKDA  He has Are listed in his med list, but that was prescribed back when he had a head injury and skull fracture.  He is not taking it since discharge from the hospital in 2022.  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 Authorizing Provider  benzonatate  (TESSALON ) 100 MG capsule Take 1 capsule (100 mg total) by mouth 3 (three) times daily as needed for cough. 03/21/24  Yes Vonna Sharlet POUR, MD  ondansetron  (ZOFRAN -ODT) 4 MG disintegrating tablet Take 1 tablet (4 mg total) by mouth every 8 (eight) hours as needed for nausea or vomiting. 03/21/24  Yes Vonna Sharlet POUR, MD  oseltamivir  (TAMIFLU ) 75 MG capsule Take 1 capsule (75 mg total) by mouth every 12 (twelve) hours. 03/21/24  Yes Vonna Sharlet POUR, MD    Family History History reviewed. No pertinent family history.  Social History Social  History[1]   Allergies   Patient has no known allergies.   Review of Systems Review of Systems  Respiratory:  Positive for cough.      Physical Exam Triage Vital Signs ED Triage Vitals [03/21/24 1328]  Encounter Vitals Group     BP 96/76     Girls Systolic BP Percentile      Girls Diastolic BP Percentile      Boys Systolic BP Percentile      Boys Diastolic BP Percentile      Pulse Rate 91     Resp 16     Temp 98.6 F (37 C)     Temp Source Oral     SpO2 97 %     Weight      Height      Head Circumference      Peak Flow      Pain Score 0     Pain Loc      Pain Education      Exclude from Growth Chart    No data found.  Updated Vital Signs BP 96/76 (BP Location: Left Arm)   Pulse 91   Temp 98.6 F (37 C) (Oral)   Resp 16   SpO2 97%   Visual Acuity Right Eye Distance:   Left Eye Distance:   Bilateral Distance:    Right Eye Near:   Left Eye Near:    Bilateral Near:  Physical Exam Vitals reviewed.  Constitutional:      General: He is not in acute distress.    Appearance: He is not ill-appearing, toxic-appearing or diaphoretic.  HENT:     Right Ear: Tympanic membrane and ear canal normal.     Left Ear: Tympanic membrane and ear canal normal.     Nose: Congestion present.     Mouth/Throat:     Mouth: Mucous membranes are moist.     Comments: There is clear mucus draining in the oropharynx Eyes:     Extraocular Movements: Extraocular movements intact.     Conjunctiva/sclera: Conjunctivae normal.     Pupils: Pupils are equal, round, and reactive to light.  Cardiovascular:     Rate and Rhythm: Normal rate and regular rhythm.     Heart sounds: No murmur heard. Pulmonary:     Effort: Pulmonary effort is normal. No respiratory distress.     Breath sounds: Normal breath sounds. No stridor. No wheezing, rhonchi or rales.  Musculoskeletal:     Cervical back: Neck supple.  Lymphadenopathy:     Cervical: No cervical adenopathy.  Skin:    Capillary  Refill: Capillary refill takes less than 2 seconds.     Coloration: Skin is not jaundiced or pale.  Neurological:     General: No focal deficit present.     Mental Status: He is alert and oriented to person, place, and time.  Psychiatric:        Behavior: Behavior normal.      UC Treatments / Results  Labs (all labs ordered are listed, but only abnormal results are displayed) Labs Reviewed  POCT INFLUENZA A/B - Abnormal; Notable for the following components:      Result Value   Influenza A, POC Positive (*)    All other components within normal limits  POC SOFIA SARS ANTIGEN FIA - Normal    EKG   Radiology No results found.  Procedures Procedures (including critical care time)  Medications Ordered in UC Medications - No data to display  Initial Impression / Assessment and Plan / UC Course  I have reviewed the triage vital signs and the nursing notes.  Pertinent labs & imaging results that were available during my care of the patient were reviewed by me and considered in my medical decision making (see chart for details).     Flu test is positive.  COVID is negative  Tamiflu  was sent in to treat, along with Zofran  for the nausea and Tessalon  Perles for his cough Final Clinical Impressions(s) / UC Diagnoses   Final diagnoses:  Viral URI  Flu     Discharge Instructions      Your test for flu was positive COVID test was negative  Take oseltamivir  75 mg--1 capsule 2 times daily for 5 days  Take benzonatate  100 mg, 1 tab every 8 hours as needed for cough.  Ondansetron  dissolved in the mouth every 8 hours as needed for nausea or vomiting. Clear liquids(water, gatorade/pedialyte, ginger ale/sprite, chicken broth/soup) and bland things(crackers/toast, rice, potato, bananas) to eat. Avoid acidic foods like lemon/lime/orange/tomato, and avoid greasy/spicy foods.  Tylenol  would be the easiest on your stomach to take for pain and fever.     ED Prescriptions      Medication Sig Dispense Auth. Provider   oseltamivir  (TAMIFLU ) 75 MG capsule Take 1 capsule (75 mg total) by mouth every 12 (twelve) hours. 10 capsule Vonna Sharlet POUR, MD   ondansetron  (ZOFRAN -ODT) 4 MG disintegrating tablet Take 1  tablet (4 mg total) by mouth every 8 (eight) hours as needed for nausea or vomiting. 10 tablet Vonna Sharlet POUR, MD   benzonatate  (TESSALON ) 100 MG capsule Take 1 capsule (100 mg total) by mouth 3 (three) times daily as needed for cough. 21 capsule Waverly Tarquinio K, MD      PDMP not reviewed this encounter.    [1]  Social History Tobacco Use   Smoking status: Former    Current packs/day: 0.50    Types: Cigarettes   Smokeless tobacco: Never  Vaping Use   Vaping status: Never Used  Substance Use Topics   Alcohol use: Not Currently    Alcohol/week: 35.0 standard drinks of alcohol    Types: 35 Cans of beer per week   Drug use: Not Currently    Types: Marijuana, Cocaine     Vonna Sharlet POUR, MD 03/21/24 1417  "
